# Patient Record
Sex: Male | Born: 1975 | Race: White | Hispanic: Yes | Marital: Married | Smoking: Never smoker
Health system: Southern US, Community
[De-identification: ages and names within clinical notes are randomized; demographics above are authoritative.]

## PROBLEM LIST (undated history)

## (undated) DIAGNOSIS — T7840XA Allergy, unspecified, initial encounter: Secondary | ICD-10-CM

## (undated) DIAGNOSIS — K219 Gastro-esophageal reflux disease without esophagitis: Secondary | ICD-10-CM

## (undated) HISTORY — DX: Gastro-esophageal reflux disease without esophagitis: K21.9

## (undated) HISTORY — DX: Allergy, unspecified, initial encounter: T78.40XA

---

## 2005-01-28 ENCOUNTER — Ambulatory Visit (HOSPITAL_COMMUNITY): Admission: RE | Admit: 2005-01-28 | Discharge: 2005-01-28 | Payer: Self-pay | Admitting: Family Medicine

## 2005-01-28 ENCOUNTER — Emergency Department (HOSPITAL_COMMUNITY): Admission: EM | Admit: 2005-01-28 | Discharge: 2005-01-28 | Payer: Self-pay | Admitting: Family Medicine

## 2005-03-06 ENCOUNTER — Emergency Department (HOSPITAL_COMMUNITY): Admission: EM | Admit: 2005-03-06 | Discharge: 2005-03-06 | Payer: Self-pay | Admitting: Emergency Medicine

## 2005-03-12 ENCOUNTER — Emergency Department (HOSPITAL_COMMUNITY): Admission: EM | Admit: 2005-03-12 | Discharge: 2005-03-12 | Payer: Self-pay | Admitting: Emergency Medicine

## 2005-06-10 ENCOUNTER — Encounter: Admission: RE | Admit: 2005-06-10 | Discharge: 2005-06-10 | Payer: Self-pay | Admitting: Internal Medicine

## 2005-07-26 ENCOUNTER — Ambulatory Visit: Payer: Self-pay | Admitting: Gastroenterology

## 2005-08-01 ENCOUNTER — Ambulatory Visit: Payer: Self-pay | Admitting: Gastroenterology

## 2005-08-03 ENCOUNTER — Ambulatory Visit: Payer: Self-pay | Admitting: Gastroenterology

## 2005-08-30 ENCOUNTER — Ambulatory Visit: Payer: Self-pay | Admitting: Gastroenterology

## 2005-08-30 ENCOUNTER — Ambulatory Visit (HOSPITAL_COMMUNITY): Admission: RE | Admit: 2005-08-30 | Discharge: 2005-08-30 | Payer: Self-pay | Admitting: Gastroenterology

## 2005-10-04 ENCOUNTER — Ambulatory Visit (HOSPITAL_COMMUNITY): Admission: RE | Admit: 2005-10-04 | Discharge: 2005-10-04 | Payer: Self-pay | Admitting: Gastroenterology

## 2005-10-19 ENCOUNTER — Encounter: Admission: RE | Admit: 2005-10-19 | Discharge: 2005-10-19 | Payer: Self-pay | Admitting: Internal Medicine

## 2005-11-03 ENCOUNTER — Encounter: Admission: RE | Admit: 2005-11-03 | Discharge: 2005-11-03 | Payer: Self-pay | Admitting: Internal Medicine

## 2007-07-31 ENCOUNTER — Emergency Department (HOSPITAL_COMMUNITY): Admission: EM | Admit: 2007-07-31 | Discharge: 2007-07-31 | Payer: Self-pay | Admitting: Emergency Medicine

## 2007-08-24 IMAGING — CR DG SMALL BOWEL
1 series · 1 of 1 positions shown · non-contrast
Comparison: none

CLINICAL DATA: Right lower quadrant pain.  
 SMALL BOWEL FOLLOWTHROUGH:

[view not recorded]
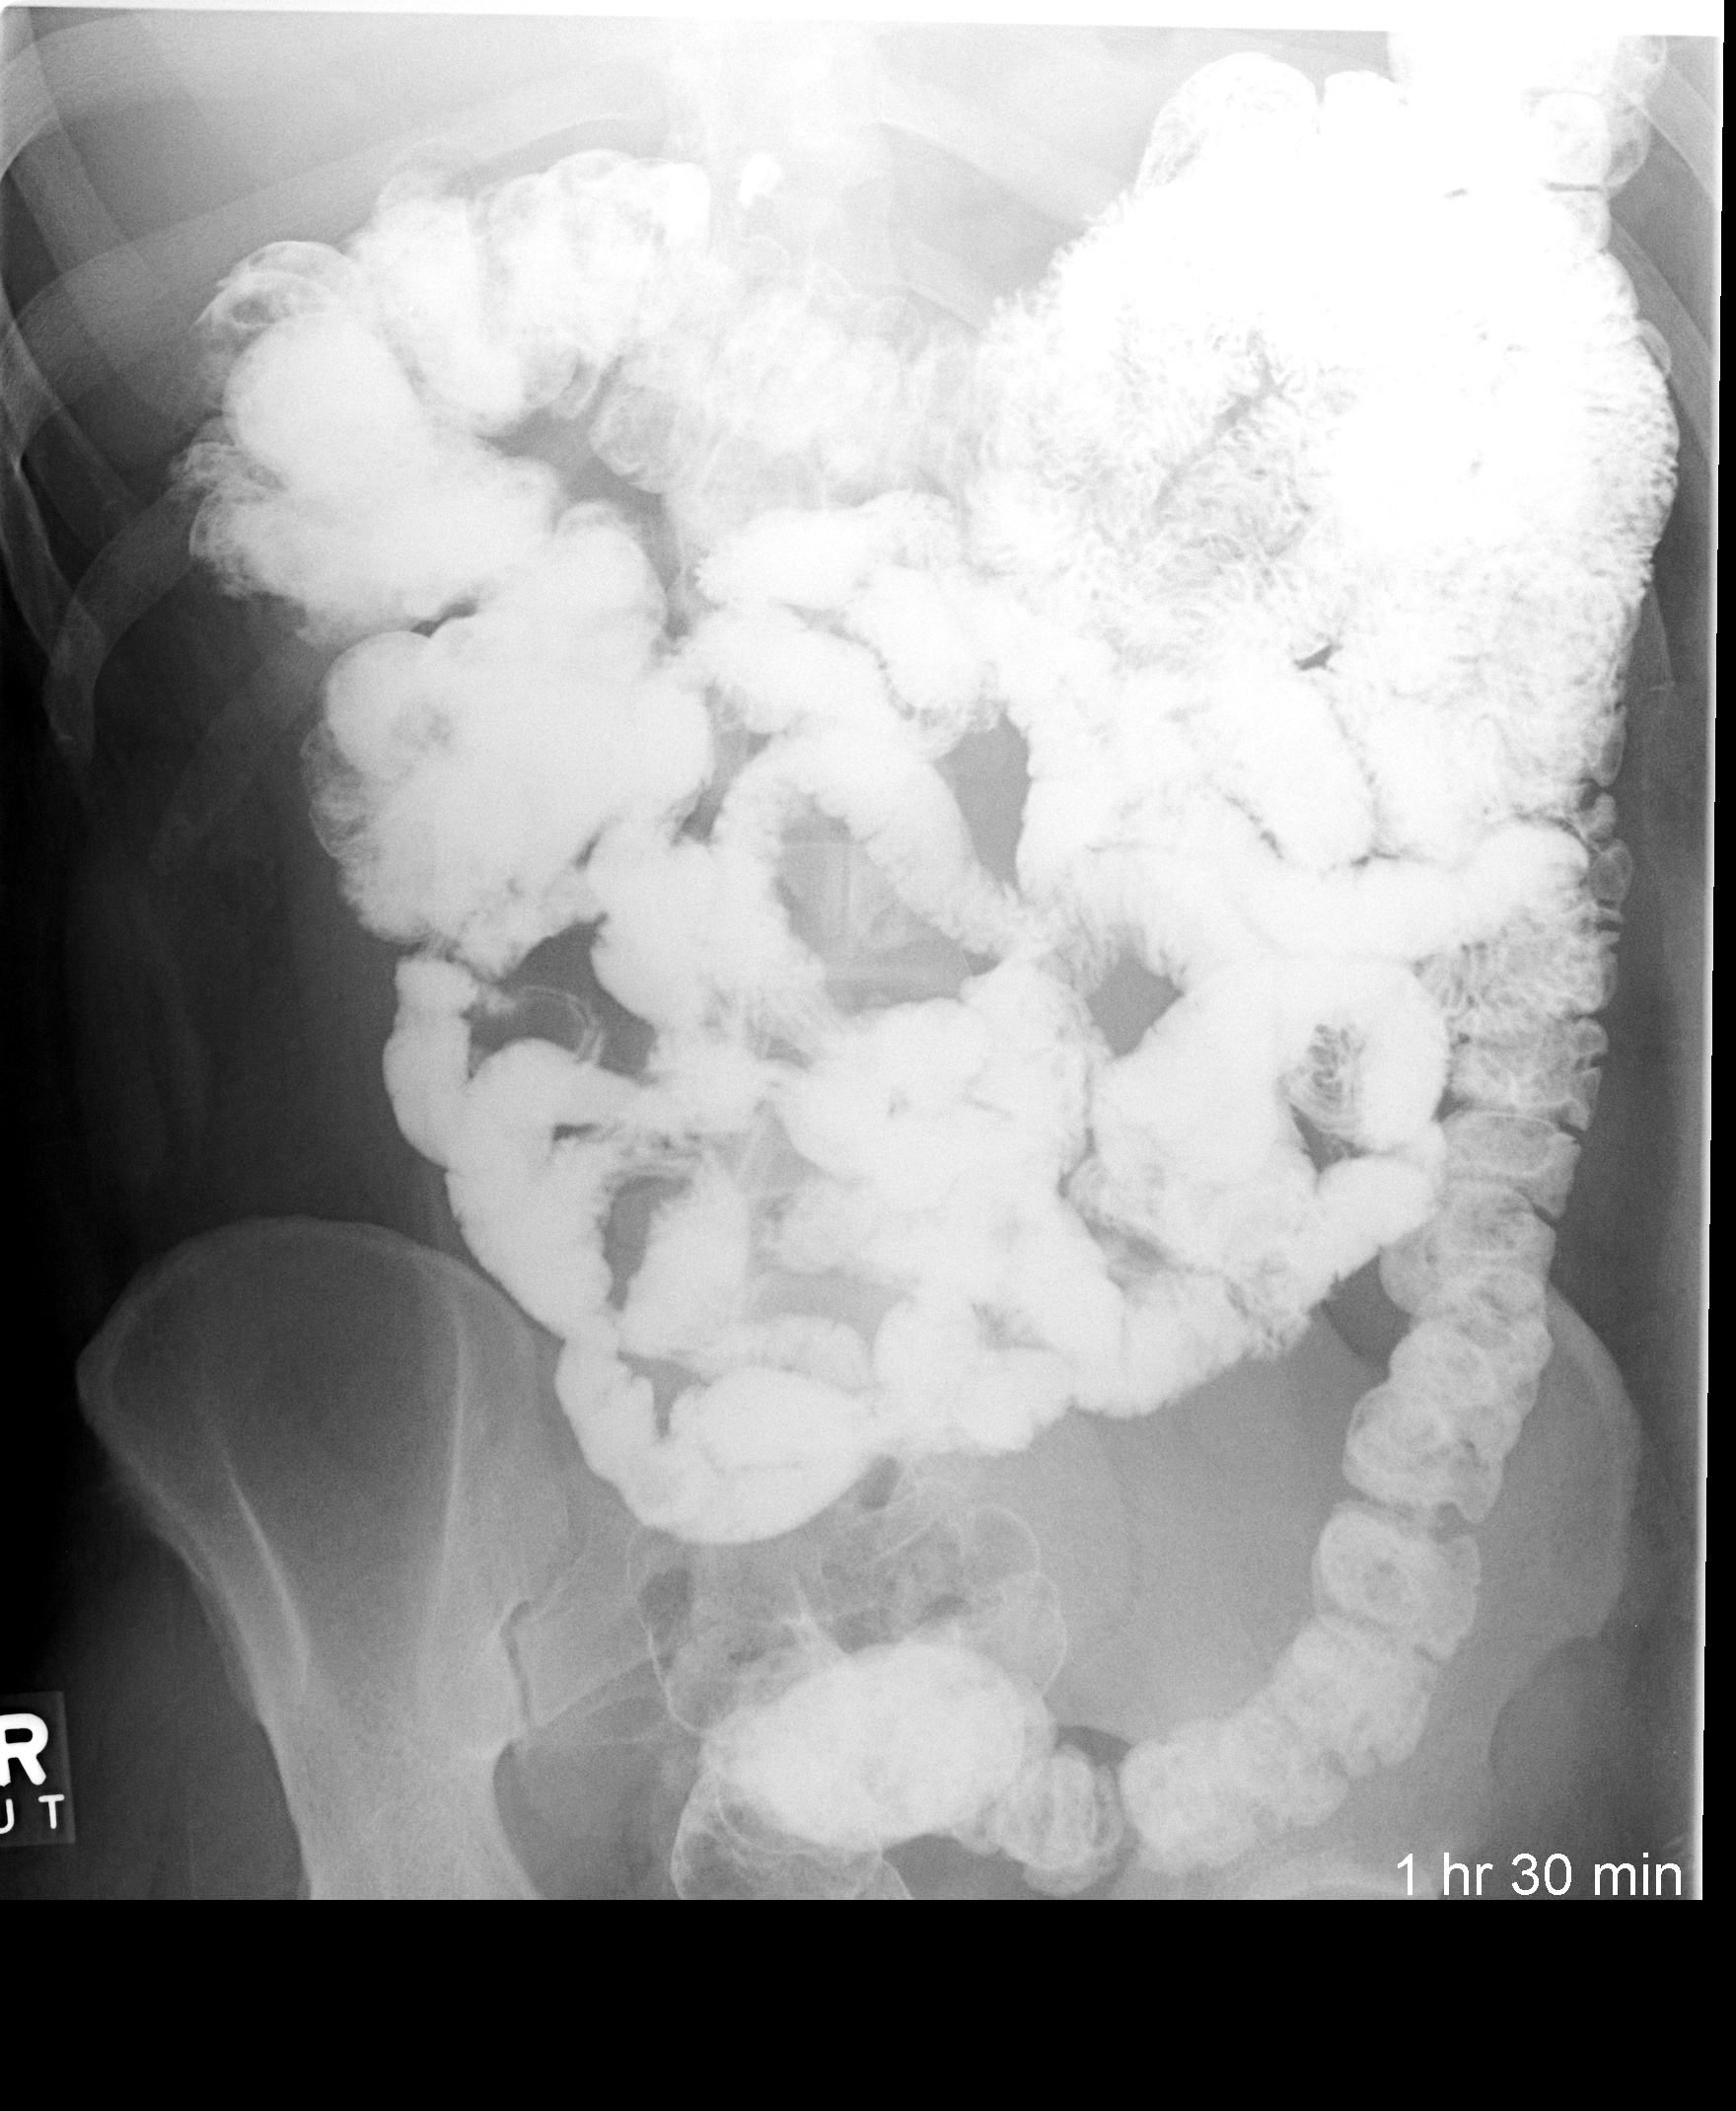

[1 of 1 positions shown; findings below may reference images not displayed]

FINDINGS: Preliminary KUB is unremarkable.  Position and mucosal pattern of the small bowel is normal.  Transit time is normal with the colon being visualized at 1 ? hours.  Terminal ileum is thought to be visualized although the cecum is somewhat medially positioned and is thought to be mobile.  No inflammatory changes or obstruction.
IMPRESSION: 1.  Small bowel followthrough reveals no abnormality. 
 2.  There is thought to be a mobile cecum.

## 2007-12-19 DIAGNOSIS — B356 Tinea cruris: Secondary | ICD-10-CM | POA: Insufficient documentation

## 2007-12-19 DIAGNOSIS — K5900 Constipation, unspecified: Secondary | ICD-10-CM | POA: Insufficient documentation

## 2007-12-19 DIAGNOSIS — M545 Low back pain, unspecified: Secondary | ICD-10-CM | POA: Insufficient documentation

## 2007-12-19 DIAGNOSIS — R635 Abnormal weight gain: Secondary | ICD-10-CM | POA: Insufficient documentation

## 2007-12-24 DIAGNOSIS — R748 Abnormal levels of other serum enzymes: Secondary | ICD-10-CM | POA: Insufficient documentation

## 2007-12-24 DIAGNOSIS — K449 Diaphragmatic hernia without obstruction or gangrene: Secondary | ICD-10-CM | POA: Insufficient documentation

## 2008-04-08 DIAGNOSIS — B9789 Other viral agents as the cause of diseases classified elsewhere: Secondary | ICD-10-CM | POA: Insufficient documentation

## 2008-06-12 ENCOUNTER — Emergency Department (HOSPITAL_COMMUNITY): Admission: EM | Admit: 2008-06-12 | Discharge: 2008-06-12 | Payer: Self-pay | Admitting: Emergency Medicine

## 2008-06-20 ENCOUNTER — Emergency Department (HOSPITAL_COMMUNITY): Admission: EM | Admit: 2008-06-20 | Discharge: 2008-06-20 | Payer: Self-pay | Admitting: Emergency Medicine

## 2008-06-25 DIAGNOSIS — S335XXA Sprain of ligaments of lumbar spine, initial encounter: Secondary | ICD-10-CM | POA: Insufficient documentation

## 2009-12-14 ENCOUNTER — Emergency Department (HOSPITAL_COMMUNITY): Admission: EM | Admit: 2009-12-14 | Discharge: 2009-12-14 | Payer: Self-pay | Admitting: Emergency Medicine

## 2010-10-02 ENCOUNTER — Encounter: Payer: Self-pay | Admitting: Gastroenterology

## 2010-12-01 LAB — CBC
Hemoglobin: 15.8 g/dL (ref 13.0–17.0)
RBC: 5.13 MIL/uL (ref 4.22–5.81)

## 2010-12-01 LAB — DIFFERENTIAL
Basophils Absolute: 0 10*3/uL (ref 0.0–0.1)
Lymphocytes Relative: 10 % — ABNORMAL LOW (ref 12–46)
Monocytes Absolute: 0.6 10*3/uL (ref 0.1–1.0)
Monocytes Relative: 4 % (ref 3–12)
Neutro Abs: 12.2 10*3/uL — ABNORMAL HIGH (ref 1.7–7.7)

## 2010-12-01 LAB — URINALYSIS, ROUTINE W REFLEX MICROSCOPIC
Glucose, UA: NEGATIVE mg/dL
Specific Gravity, Urine: 1.011 (ref 1.005–1.030)

## 2010-12-01 LAB — URINE MICROSCOPIC-ADD ON

## 2010-12-01 LAB — POCT I-STAT, CHEM 8
Chloride: 107 mEq/L (ref 96–112)
Glucose, Bld: 130 mg/dL — ABNORMAL HIGH (ref 70–99)
HCT: 48 % (ref 39.0–52.0)
Potassium: 4.2 mEq/L (ref 3.5–5.1)

## 2011-06-21 LAB — COMPREHENSIVE METABOLIC PANEL
ALT: 71 — ABNORMAL HIGH
AST: 32
Albumin: 3.7
Alkaline Phosphatase: 53
Chloride: 107
GFR calc Af Amer: >60
Potassium: 3.6
Sodium: 139
Total Bilirubin: 0.9

## 2011-06-21 LAB — URINE MICROSCOPIC-ADD ON

## 2011-06-21 LAB — URINALYSIS, ROUTINE W REFLEX MICROSCOPIC
Bilirubin Urine: NEGATIVE
Leukocytes, UA: NEGATIVE
Nitrite: NEGATIVE
Specific Gravity, Urine: 1.019
pH: 5

## 2011-06-21 LAB — CBC
HCT: 42.7
Platelets: 172
WBC: 8.4

## 2011-06-21 LAB — DIFFERENTIAL
Basophils Absolute: 0
Basophils Relative: 0
Eosinophils Absolute: 0.1 — ABNORMAL LOW
Lymphs Abs: 2.1
Neutro Abs: 5.6

## 2013-09-23 ENCOUNTER — Other Ambulatory Visit: Payer: Self-pay | Admitting: Gastroenterology

## 2013-09-23 DIAGNOSIS — R1011 Right upper quadrant pain: Secondary | ICD-10-CM

## 2013-09-25 ENCOUNTER — Other Ambulatory Visit: Payer: Self-pay | Admitting: Gastroenterology

## 2013-09-25 DIAGNOSIS — R1011 Right upper quadrant pain: Secondary | ICD-10-CM

## 2013-10-10 ENCOUNTER — Encounter (HOSPITAL_COMMUNITY)
Admission: RE | Admit: 2013-10-10 | Discharge: 2013-10-10 | Disposition: A | Discharge status: Home / Self Care | Payer: BC Managed Care – PPO | Source: Ambulatory Visit | Attending: Gastroenterology | Admitting: Gastroenterology

## 2013-10-10 DIAGNOSIS — R1011 Right upper quadrant pain: Secondary | ICD-10-CM | POA: Insufficient documentation

## 2013-10-10 MED ORDER — STERILE WATER FOR INJECTION IJ SOLN
INTRAMUSCULAR | Status: AC
  Filled 2013-10-10: qty 10

## 2013-10-10 MED ORDER — TECHNETIUM TC 99M MEBROFENIN IV KIT
5.0000 | PACK | Freq: Once | INTRAVENOUS | Status: AC | PRN
Start: 2013-10-10 — End: 2013-10-10
  Administered 2013-10-10: 5 via INTRAVENOUS

## 2013-10-10 MED ORDER — SINCALIDE 5 MCG IJ SOLR
INTRAMUSCULAR | Status: AC
  Administered 2013-10-10: 1.73 ug
  Filled 2013-10-10: qty 5

## 2015-11-06 DIAGNOSIS — K219 Gastro-esophageal reflux disease without esophagitis: Secondary | ICD-10-CM | POA: Insufficient documentation

## 2018-04-23 ENCOUNTER — Other Ambulatory Visit: Payer: Self-pay

## 2018-04-23 ENCOUNTER — Encounter (HOSPITAL_COMMUNITY): Payer: Self-pay | Admitting: Emergency Medicine

## 2018-04-23 ENCOUNTER — Emergency Department (HOSPITAL_COMMUNITY)
Admission: EM | Admit: 2018-04-23 | Discharge: 2018-04-23 | Disposition: A | Discharge status: Home / Self Care | Payer: Self-pay | Attending: Emergency Medicine | Admitting: Emergency Medicine

## 2018-04-23 DIAGNOSIS — Z91010 Allergy to peanuts: Secondary | ICD-10-CM | POA: Insufficient documentation

## 2018-04-23 DIAGNOSIS — Z87442 Personal history of urinary calculi: Secondary | ICD-10-CM | POA: Insufficient documentation

## 2018-04-23 DIAGNOSIS — R109 Unspecified abdominal pain: Secondary | ICD-10-CM | POA: Insufficient documentation

## 2018-04-23 LAB — URINALYSIS, ROUTINE W REFLEX MICROSCOPIC
Bacteria, UA: NONE SEEN
Bilirubin Urine: NEGATIVE
Glucose, UA: NEGATIVE mg/dL
Ketones, ur: NEGATIVE mg/dL
Leukocytes, UA: NEGATIVE
Nitrite: NEGATIVE
PROTEIN: NEGATIVE mg/dL
SPECIFIC GRAVITY, URINE: 1.014 (ref 1.005–1.030)
pH: 6 (ref 5.0–8.0)

## 2018-04-23 MED ORDER — IBUPROFEN 400 MG PO TABS
400.0000 mg | ORAL_TABLET | Freq: Once | ORAL | Status: AC | PRN
  Administered 2018-04-23: 400 mg via ORAL
  Filled 2018-04-23: qty 1

## 2018-04-23 MED ORDER — TAMSULOSIN HCL 0.4 MG PO CAPS: 0.4000 mg | ORAL_CAPSULE | Freq: Every day | ORAL | 0 refills | Status: DC

## 2018-04-23 NOTE — ED Provider Notes (Signed)
[facility]   CSN: [id number] Arrival date & time: 04/23/18  1844     History   Chief Complaint Chief Complaint  Patient presents with  . Flank Pain    HPI Matthew Mcpherson is a 42 y.o. male.  Patient here for evaluation of left flank pain that started several days ago. No fever, dysuria or frequency. He has seen blood in his urine. The pain radiates to groin but he denies scrotal swelling or significant testicular pain. He has a history of kidney stones and reports this has felt the same. Since arrival and prior to being seen the patient reports he went to the bathroom and felt that he may have passed something when urinating. The pain has been resolved since then.   The history is provided by the patient. No language interpreter was used.    History reviewed. No pertinent past medical history.  There are no active problems to display for this patient.   History reviewed. No pertinent surgical history.      Home Medications    Prior to Admission medications   Not on File    Family History No family history on file.  Social History Social History   Tobacco Use  . Smoking status: Never Smoker  . Smokeless tobacco: Never Used  Substance Use Topics  . Alcohol use: Not on file  . Drug use: Not on file     Allergies   Peanut-containing drug products   Review of Systems Review of Systems  Constitutional: Negative for fever.  Gastrointestinal: Positive for nausea. Negative for vomiting.  Genitourinary: Positive for flank pain and hematuria. Negative for difficulty urinating and dysuria.  Skin: Negative.   Neurological: Negative.      Physical Exam Updated Vital Signs BP 122/76   Pulse (!) 53   Temp 98.2 F (36.8 C) (Oral)   Resp 16   Ht 5\' 4"  (1.626 m)   Wt 81.6 kg   SpO2 100%   BMI 30.90 kg/m   Physical Exam  Constitutional: He is oriented to person, place, and time. He appears  well-developed and well-nourished.  Neck: Normal range of motion.  Pulmonary/Chest: Effort normal.  Abdominal: Soft. There is no tenderness.  Genitourinary:  Genitourinary Comments: No flank tenderness.   Musculoskeletal: Normal range of motion.  Neurological: He is alert and oriented to person, place, and time.  Skin: Skin is warm and dry.  Psychiatric: He has a normal mood and affect.  Nursing note and vitals reviewed.    ED Treatments / Results  Labs (all labs ordered are listed, but only abnormal results are displayed) Labs Reviewed  URINALYSIS, ROUTINE W REFLEX MICROSCOPIC - Abnormal; Notable for the following components:      Result Value   Hgb urine dipstick LARGE (*)    All other components within normal limits    EKG None  Radiology No results found.  Procedures Procedures (including critical care time)  Medications Ordered in ED Medications  ibuprofen (ADVIL,MOTRIN) tablet 400 mg (400 mg Oral Given 04/23/18 1917)     Initial Impression / Assessment and Plan / ED Course  I have reviewed the triage vital signs and the nursing notes.  Pertinent labs & imaging results that were available during my care of the patient were reviewed by me and considered in my medical decision making (see chart for details).     Patient with history of kidney stones presents with left flank pain and gross hematuria  x 2-3 days. Since arrival he feels he passed a stone in his urine prior to being seen and has had no further pain.   CT imaging was not felt necessary or appropriate with presentation c/w recurrent kidney stone that has passed. He is comfortable with discharge home. Rx Flomax given is he has recurrent symptoms. Return precautions discussed.   Final Clinical Impressions(s) / ED Diagnoses   Final diagnoses:  None   Renal colic  ED Discharge Orders    None       Danne HarborUpstill, Winni Ehrhard, PA-C 04/23/18 2342    Gwyneth SproutPlunkett, [place], MD 04/23/18 2349

## 2018-04-23 NOTE — ED Notes (Signed)
Discharge instructions discussed with Pt. Pt verbalized understanding. Pt stable and ambulatory.    

## 2018-04-23 NOTE — ED Triage Notes (Signed)
Pt reports left flank pain that started Friday. Pt reports nausea and hematuria. Pt reports otc powder with some relief. Hx of kidney stones and reports it feels like it.

## 2019-10-09 ENCOUNTER — Emergency Department (HOSPITAL_COMMUNITY)
Admission: EM | Admit: 2019-10-09 | Discharge: 2019-10-09 | Disposition: A | Discharge status: Home / Self Care | Payer: Self-pay | Attending: Emergency Medicine | Admitting: Emergency Medicine

## 2019-10-09 ENCOUNTER — Emergency Department (HOSPITAL_COMMUNITY): Payer: Self-pay

## 2019-10-09 DIAGNOSIS — N134 Hydroureter: Secondary | ICD-10-CM | POA: Insufficient documentation

## 2019-10-09 DIAGNOSIS — N201 Calculus of ureter: Secondary | ICD-10-CM

## 2019-10-09 DIAGNOSIS — Z79899 Other long term (current) drug therapy: Secondary | ICD-10-CM | POA: Insufficient documentation

## 2019-10-09 DIAGNOSIS — R110 Nausea: Secondary | ICD-10-CM | POA: Insufficient documentation

## 2019-10-09 DIAGNOSIS — N130 Hydronephrosis with ureteropelvic junction obstruction: Secondary | ICD-10-CM | POA: Insufficient documentation

## 2019-10-09 DIAGNOSIS — Z87442 Personal history of urinary calculi: Secondary | ICD-10-CM | POA: Insufficient documentation

## 2019-10-09 LAB — COMPREHENSIVE METABOLIC PANEL
ALT: 38 U/L (ref 0–44)
AST: 24 U/L (ref 15–41)
Albumin: 4 g/dL (ref 3.5–5.0)
Alkaline Phosphatase: 52 U/L (ref 38–126)
Anion gap: 9 (ref 5–15)
BUN: 11 mg/dL (ref 6–20)
CO2: 25 mmol/L (ref 22–32)
Calcium: 8.7 mg/dL — ABNORMAL LOW (ref 8.9–10.3)
Chloride: 102 mmol/L (ref 98–111)
Creatinine, Ser: 0.98 mg/dL (ref 0.61–1.24)
GFR calc Af Amer: >60 mL/min (ref >60)
GFR calc non Af Amer: >60 mL/min (ref >60)
Glucose, Bld: 109 mg/dL — ABNORMAL HIGH (ref 70–99)
Potassium: 3.8 mmol/L (ref 3.5–5.1)
Sodium: 136 mmol/L (ref 135–145)
Total Bilirubin: 1.1 mg/dL (ref 0.3–1.2)
Total Protein: 7.1 g/dL (ref 6.5–8.1)

## 2019-10-09 LAB — CBC
HCT: 44.5 % (ref 39.0–52.0)
Hemoglobin: 15.2 g/dL (ref 13.0–17.0)
MCH: 30 pg (ref 26.0–34.0)
MCHC: 34.2 g/dL (ref 30.0–36.0)
MCV: 87.9 fL (ref 80.0–100.0)
Platelets: 183 10*3/uL (ref 150–400)
RBC: 5.06 MIL/uL (ref 4.22–5.81)
RDW: 12.5 % (ref 11.5–15.5)
WBC: 5.7 10*3/uL (ref 4.0–10.5)
nRBC: 0 % (ref 0.0–0.2)

## 2019-10-09 LAB — URINALYSIS, ROUTINE W REFLEX MICROSCOPIC
Bacteria, UA: NONE SEEN
Bilirubin Urine: NEGATIVE
Glucose, UA: NEGATIVE mg/dL
Ketones, ur: NEGATIVE mg/dL
Leukocytes,Ua: NEGATIVE
Nitrite: NEGATIVE
Protein, ur: NEGATIVE mg/dL
Specific Gravity, Urine: 1.004 — ABNORMAL LOW (ref 1.005–1.030)
pH: 7 (ref 5.0–8.0)

## 2019-10-09 LAB — LIPASE, BLOOD: Lipase: 24 U/L (ref 11–51)

## 2019-10-09 MED ORDER — SODIUM CHLORIDE 0.9 % IV BOLUS
1000.0000 mL | Freq: Once | INTRAVENOUS | Status: AC
  Administered 2019-10-09: 1000 mL via INTRAVENOUS

## 2019-10-09 MED ORDER — MORPHINE SULFATE (PF) 4 MG/ML IV SOLN
4.0000 mg | Freq: Once | INTRAVENOUS | Status: AC
  Administered 2019-10-09: 11:00:00 4 mg via INTRAVENOUS
  Filled 2019-10-09: qty 1

## 2019-10-09 MED ORDER — OXYCODONE-ACETAMINOPHEN 5-325 MG PO TABS: 1.0000 | ORAL_TABLET | ORAL | 0 refills | Status: DC | PRN

## 2019-10-09 MED ORDER — TAMSULOSIN HCL 0.4 MG PO CAPS: 0.4000 mg | ORAL_CAPSULE | Freq: Every day | ORAL | 0 refills | Status: DC

## 2019-10-09 MED ORDER — ONDANSETRON 4 MG PO TBDP: 4.0000 mg | ORAL_TABLET | Freq: Three times a day (TID) | ORAL | 0 refills | Status: DC | PRN

## 2019-10-09 MED ORDER — ONDANSETRON HCL 4 MG/2ML IJ SOLN
4.0000 mg | Freq: Once | INTRAMUSCULAR | Status: AC
  Administered 2019-10-09: 4 mg via INTRAVENOUS
  Filled 2019-10-09: qty 2

## 2019-10-09 NOTE — ED Provider Notes (Signed)
[facility] EMERGENCY DEPARTMENT Provider Note   CSN: [id number] Arrival date & time: 10/09/19  3903     History Chief Complaint  Patient presents with  . Flank Pain    Matthew Mcpherson is a 44 y.o. male.  HPI  Patient resents with right flank pain that radiates to his right lower quadrant has been progressively worsening for the past 3 weeks.  States he has a history of kidney stones and states that this pain feels similar to prior.  States that today he woke up with severe 10/10 pain that is stabbing intermittent in his right lower quadrant that radiates to his groin and testicles.  Denies any concern for sexually transmitted diseases, denies any testicular pain, penile discharge, urinary symptoms.  Denies any gross hematuria.  Patient states his last kidney stone was approximately 10 years ago.  States that he was seen in ED for possible kidney stone August 2019 but did not CT scan at the time because his pain improved during ED visit and he was presumed to have passed it.  Patient endorses associated chills and nausea with no vomiting.  No fevers, diaphoresis, chest pain change in bowel movements.  No cough or congestion.    No past medical history on file.  There are no problems to display for this patient.   No past surgical history on file.     No family history on file.  Social History   Tobacco Use  . Smoking status: Never Smoker  . Smokeless tobacco: Never Used  Substance Use Topics  . Alcohol use: Not on file  . Drug use: Not on file    Home Medications Prior to Admission medications   Medication Sig Start Date End Date Taking? Authorizing Provider  cetirizine (ZYRTEC) 10 MG tablet Take 10 mg by mouth daily.   Yes [provider]  omeprazole (PRILOSEC) 20 MG capsule Take 20 mg by mouth daily.   Yes [provider]  ondansetron (ZOFRAN ODT) 4 MG disintegrating tablet Take 1 tablet (4 mg total) by mouth every 8 (eight) hours  as needed for nausea or vomiting. 10/09/19   Gailen Shelter, PA  oxyCODONE-acetaminophen (PERCOCET/ROXICET) 5-325 MG tablet Take 1-2 tablets by mouth every 4 (four) hours as needed for severe pain. 10/09/19   Gailen Shelter, PA    Allergies    Peanut-containing drug products  Review of Systems   Review of Systems  Constitutional: Positive for chills. Negative for fever.  HENT: Negative for congestion.   Eyes: Negative for pain.  Respiratory: Negative for cough and shortness of breath.   Cardiovascular: Negative for chest pain and leg swelling.  Gastrointestinal: Positive for abdominal pain. Negative for vomiting.  Genitourinary: Positive for flank pain. Negative for dysuria.  Musculoskeletal: Negative for myalgias.  Skin: Negative for rash.  Neurological: Negative for dizziness and headaches.    Physical Exam Updated Vital Signs BP (!) 161/92 (BP Location: Left Arm)   Pulse 74   Temp 97.6 F (36.4 C) (Oral)   Resp (!) 22   SpO2 100%   Physical Exam Vitals and nursing note reviewed.  Constitutional:      General: He is not in acute distress. HENT:     Head: Normocephalic and atraumatic.     Nose: Nose normal.     Mouth/Throat:     Mouth: Mucous membranes are dry.     Pharynx: Oropharynx is clear. No oropharyngeal exudate.     Comments: Or mucosa slightly dry.  Eyes:     General: No scleral icterus.    Extraocular Movements: Extraocular movements intact.     Pupils: Pupils are equal, round, and reactive to light.  Cardiovascular:     Rate and Rhythm: Normal rate and regular rhythm.     Pulses: Normal pulses.     Heart sounds: Normal heart sounds.  Pulmonary:     Effort: Pulmonary effort is normal. No respiratory distress.     Breath sounds: No wheezing.  Abdominal:     Palpations: Abdomen is soft.     Tenderness: There is abdominal tenderness. There is right CVA tenderness. There is no left CVA tenderness, guarding or rebound.     Comments: Abdomen is soft and  nontender apart from right lower quadrant abdominal pain with palpation.  No rebound or guarding.  Positive right CVA tenderness.  No CVA tenderness on left.  Musculoskeletal:     Cervical back: Normal range of motion.     Right lower leg: No edema.     Left lower leg: No edema.  Skin:    General: Skin is warm and dry.     Capillary Refill: Capillary refill takes less than 2 seconds.  Neurological:     Mental Status: He is alert. Mental status is at baseline.  Psychiatric:        Mood and Affect: Mood normal.        Behavior: Behavior normal.     ED Results / Procedures / Treatments   Labs (all labs ordered are listed, but only abnormal results are displayed) Labs Reviewed  URINALYSIS, ROUTINE W REFLEX MICROSCOPIC - Abnormal; Notable for the following components:      Result Value   Color, Urine STRAW (*)    Specific Gravity, Urine 1.004 (*)    Hgb urine dipstick SMALL (*)    All other components within normal limits  COMPREHENSIVE METABOLIC PANEL - Abnormal; Notable for the following components:   Glucose, Bld 109 (*)    Calcium 8.7 (*)    All other components within normal limits  CBC  LIPASE, BLOOD    EKG None  Radiology CT Renal Stone Study  Result Date: 10/09/2019 CLINICAL DATA:  RIGHT flank pain for 3 weeks becoming acute today EXAM: CT ABDOMEN AND PELVIS WITHOUT CONTRAST TECHNIQUE: Multidetector CT imaging of the abdomen and pelvis was performed following the standard protocol without IV contrast. Sagittal and coronal MPR images reconstructed from axial data set. Oral contrast not administered for this indication. COMPARISON:  12/14/2009 FINDINGS: Lower chest: Lung bases clear Hepatobiliary: Gallbladder and liver normal appearance Pancreas: Normal appearance Spleen: Normal appearance Adrenals/Urinary Tract: Adrenal glands, LEFT kidney, and LEFT ureter normal appearance. Mild RIGHT hydronephrosis and hydroureter secondary to a 1-2 mm distal RIGHT ureteral calculus at the  ureterovesical junction. Minimal perinephric edema RIGHT kidney without RIGHT renal mass. Stomach/Bowel: Normal appendix. Few scattered colonic diverticula. Bowel loops otherwise normal appearance. Stomach unremarkable. Vascular/Lymphatic: Aorta normal caliber.  No adenopathy. Reproductive: Prostate gland and seminal vesicles unremarkable Other: No free air or free fluid. No hernia or inflammatory process. Musculoskeletal: Unremarkable IMPRESSION: RIGHT hydronephrosis and hydroureter secondary to a 1-2 mm RIGHT ureterovesical junction calculus. Electronically Signed   By: Ulyses Southward M.D.   On: 10/09/2019 11:15    Procedures Procedures (including critical care time)  Medications Ordered in ED Medications  morphine 4 MG/ML injection 4 mg (4 mg Intravenous Given 10/09/19 1129)  ondansetron (ZOFRAN) injection 4 mg (4 mg Intravenous Given 10/09/19 1129)  sodium  chloride 0.9 % bolus 1,000 mL (1,000 mLs Intravenous New Bag/Given 10/09/19 1129)    ED Course  I have reviewed the triage vital signs and the nursing notes.  Pertinent labs & imaging results that were available during my care of the patient were reviewed by me and considered in my medical decision making (see chart for details).  Patient with history of kidney stones presented today with right flank pain and right lower quadrant Donnell pain that is similar to his prior kidney stone episodes.  Low suspicion for PE as patient is not tachycardic or hypoxic.  Mild concern for appendicitis.  Will obtain CBC to evaluate if patient has leukocytosis and calculate avagadro score.  Patient is without leukocytosis.  avagadro score is low risk for appendicitis.  Clinical Course as of Oct 08 1217  Wed Oct 09, 2019  1215 With no evidence of infection small hemoglobin consistent with ureteral stone.  Urinalysis, Routine w reflex microscopic- may I&O cath if menses(!) [WF]  1215 CMP with no AKI or electrolyte abnormalities  Comprehensive metabolic  panel(!) [WF]  1216 No leukocytosis.  Doubt infection.  CBC [WF]  1216 CT Renal Stone Study [WF]  1216 RIGHT hydronephrosis and hydroureter secondary to a 1-2 mm RIGHT ureterovesical junction calculus.  Independently reviewed CT imaging.  Will discuss results with patient.   [WF]    Clinical Course User Index [WF] Gailen Shelter, [place]   Patient was small 2 mm right UVJ calculus.  Stones this size pass more than 75% of the time.  Will give patient pain medication and Zofran.  We will also prescribe Flomax.  Discussed medications with patient.  Patient given urology follow-up and a urine strainer.  He is well-appearing at this time.  The medical records were personally reviewed by myself. I personally reviewed all lab results and interpreted all imaging studies and either concurred with their official read or contacted radiology for clarification.   This patient appears reasonably screened and I doubt any other medical condition requiring further workup, evaluation, or treatment in the ED at this time prior to discharge.   Patient's vitals are WNL apart from vital sign abnormalities discussed above, patient is in NAD, and able to ambulate in the ED at their baseline and able to tolerate PO.  Pain has been managed or a plan has been made for home management and has no complaints prior to discharge. Patient is comfortable with above plan and for discharge at this time. All questions were answered prior to disposition. Results from the ER workup discussed with the patient face to face and all questions answered to the best of my ability. The patient is safe for discharge with strict return precautions. Patient appears safe for discharge with appropriate follow-up. Conveyed my impression with the patient and they voiced understanding and are agreeable to plan.   An After Visit Summary was printed and given to the patient.  Portions of this note were generated with Scientist, clinical (histocompatibility and immunogenetics).  Dictation errors may occur despite best attempts at proofreading.    MDM Rules/Calculators/A&P                       Final Clinical Impression(s) / ED Diagnoses Final diagnoses:  Ureterolithiasis    Rx / DC Orders ED Discharge Orders         Ordered    oxyCODONE-acetaminophen (PERCOCET/ROXICET) 5-325 MG tablet  Every 4 hours PRN     10/09/19 1217    ondansetron (  ZOFRAN ODT) 4 MG disintegrating tablet  Every 8 hours PRN     10/09/19 1217           Tedd Sias, [place] 10/09/19 1222    Davonna Belling, MD 10/09/19 1555

## 2019-10-09 NOTE — Discharge Instructions (Addendum)
Please stay hydrated as much as possible.  Take Percocet for pain as needed and Zofran for nausea.  Please strain all of your urine and follow-up with urology for analysis of any stone that you are able to procure.  I have also sent you a prescription for Flomax which you may fill or not fill at your discretion.  Evidence shows that this may help with passage of stone however I do not think that it is required for stone of the size you have.  I suspect that with time and good pain control it would pass.

## 2019-10-09 NOTE — ED Notes (Signed)
Pt to ct 

## 2019-10-09 NOTE — ED Triage Notes (Signed)
Pt reports right flank pain for 3 weeks gradually has become worse radiates into RLQ. Pt had "pain killer" at home and did take that doesn't really help. Hx of kidney stone.

## 2020-07-16 ENCOUNTER — Ambulatory Visit (INDEPENDENT_AMBULATORY_CARE_PROVIDER_SITE_OTHER): Payer: Self-pay

## 2020-07-16 ENCOUNTER — Other Ambulatory Visit: Payer: Self-pay

## 2020-07-16 ENCOUNTER — Ambulatory Visit (INDEPENDENT_AMBULATORY_CARE_PROVIDER_SITE_OTHER): Payer: Self-pay | Admitting: Physician Assistant

## 2020-07-16 VITALS — BP 143/92 | HR 74 | Temp 96.3°F | Resp 18 | Ht 65.0 in | Wt 190.0 lb

## 2020-07-16 DIAGNOSIS — J45909 Unspecified asthma, uncomplicated: Secondary | ICD-10-CM | POA: Insufficient documentation

## 2020-07-16 DIAGNOSIS — J302 Other seasonal allergic rhinitis: Secondary | ICD-10-CM

## 2020-07-16 DIAGNOSIS — R030 Elevated blood-pressure reading, without diagnosis of hypertension: Secondary | ICD-10-CM | POA: Insufficient documentation

## 2020-07-16 DIAGNOSIS — K219 Gastro-esophageal reflux disease without esophagitis: Secondary | ICD-10-CM

## 2020-07-16 DIAGNOSIS — R0602 Shortness of breath: Secondary | ICD-10-CM | POA: Insufficient documentation

## 2020-07-16 MED ORDER — MONTELUKAST SODIUM 10 MG PO TABS: 10.0000 mg | ORAL_TABLET | Freq: Every day | ORAL | 3 refills | Status: DC

## 2020-07-16 MED ORDER — OMEPRAZOLE 40 MG PO CPDR: 40.0000 mg | DELAYED_RELEASE_CAPSULE | Freq: Every day | ORAL | 3 refills | Status: DC

## 2020-07-16 MED ORDER — ALBUTEROL SULFATE HFA 108 (90 BASE) MCG/ACT IN AERS: 2.0000 | INHALATION_SPRAY | Freq: Four times a day (QID) | RESPIRATORY_TRACT | 0 refills | Status: DC | PRN

## 2020-07-16 MED ORDER — CETIRIZINE HCL 10 MG PO TABS: 10.0000 mg | ORAL_TABLET | Freq: Every day | ORAL | 2 refills | Status: DC

## 2020-07-16 NOTE — Progress Notes (Signed)
Patient complains of seasonal allergies occurring. Patient shares this morning he inhaled some dust and began to feel "SOB" and began to panic. Patient has been using OTC allergy medication for years and shares he would like to discuss an affordable prescription or alternative

## 2020-07-16 NOTE — Patient Instructions (Signed)
Please take the zyrtec in the morning and the singulair at bedtime.  Please use the inhaler as needed  Please  check your blood pressure at home, keep written log, bring log to next office visit.  Please go to [facility] for the chest xray.  We will call you with the results.  I hope that you feel better soon  [name], PA-C Physician Assistant [facility] Medicine [email or web address]   Bronchospasm, Adult  Bronchospasm is when airways in the lungs get smaller. When this happens, it can be hard to breathe. You may cough. You may also make a whistling sound when you breathe (wheeze). Follow these instructions at home: Medicines  Take over-the-counter and prescription medicines only as told by your doctor.  If you need to use an inhaler or nebulizer to take your medicine, ask your doctor how to use it.  If you were given a spacer, always use it with your inhaler. Lifestyle  Change your heating and air conditioning filter. Do this at least once a month.  Try not to use fireplaces and wood stoves.  Do not  smoke. Do not  allow smoking in your home.  Try not to use things that have a strong smell, like perfume.  Get rid of pests (such as roaches and mice) and their poop.  Remove any mold from your home.  Keep your house clean. Get rid of dust.  Use cleaning products that have no smell.  Replace carpet with wood, tile, or vinyl flooring.  Use allergy-proof pillows, mattress covers, and box spring covers.  Wash bed sheets and blankets every week. Use hot water. Dry them in a dryer.  Use blankets that are made of polyester or cotton.  Wash your hands often.  Keep pets out of your bedroom.  When you exercise, try not to breathe in cold air. General instructions  Have a plan for getting medical care. Know these things: ? When to call your doctor. ? When to call local emergency services (911 in the  [place] ? Where to go in an emergency.  Stay up to date on your shots (immunizations).  When you have an episode: ? Stay calm. ? Relax. ? Breathe slowly. Contact a doctor if:  Your muscles ache.  Your chest hurts.  The color of the mucus you cough up (sputum) changes from clear or white to yellow, green, gray, or bloody.  The mucus you cough up gets thicker.  You have a fever. Get help right away if:  The whistling sound gets worse, even after you take your medicines.  Your coughing gets worse.  You find it even harder to breathe.  Your chest hurts very much. Summary  Bronchospasm is when airways in the lungs get smaller.  When this happens, it can be hard to breathe. You may cough. You may also make a whistling sound when you breathe.  Stay away from things that cause you to have episodes. These include smoke or dust. This information is not intended to replace advice given to you by your health care provider. Make sure you discuss any questions you have with your health care provider. Document Revised: 08/11/2017 Document Reviewed: 09/01/2016 Elsevier Patient Education  2020 [employer] Inc.   Blood Pressure Record Sheet To take your blood pressure, you will need a blood pressure machine. You can buy a blood pressure machine (blood pressure monitor) at your clinic, drug store, or online. When choosing one, consider:  An automatic monitor that has  an arm cuff.  A cuff that wraps snugly around your upper arm. You should be able to fit only one finger between your arm and the cuff.  A device that stores blood pressure reading results.  Do not choose a monitor that measures your blood pressure from your wrist or finger. Follow your health care provider's instructions for how to take your blood pressure. To use this form:  Get one reading in the morning (a.m.) before you take any medicines.  Get one reading in the evening (p.m.) before supper.  Take at least 2  readings with each blood pressure check. This makes sure the results are correct. Wait 1-2 minutes between measurements.  Write down the results in the spaces on this form.  Repeat this once a week, or as told by your health care provider.  Make a follow-up appointment with your health care provider to discuss the results. Blood pressure log Date: _______________________  a.m. _____________________(1st reading) _____________________(2nd reading)  p.m. _____________________(1st reading) _____________________(2nd reading) Date: _______________________  a.m. _____________________(1st reading) _____________________(2nd reading)  p.m. _____________________(1st reading) _____________________(2nd reading) Date: _______________________  a.m. _____________________(1st reading) _____________________(2nd reading)  p.m. _____________________(1st reading) _____________________(2nd reading) Date: _______________________  a.m. _____________________(1st reading) _____________________(2nd reading)  p.m. _____________________(1st reading) _____________________(2nd reading) Date: _______________________  a.m. _____________________(1st reading) _____________________(2nd reading)  p.m. _____________________(1st reading) _____________________(2nd reading) This information is not intended to replace advice given to you by your health care provider. Make sure you discuss any questions you have with your health care provider. Document Revised: 10/27/2017 Document Reviewed: 08/29/2017 Elsevier Patient Education  2020 [employer].

## 2020-07-16 NOTE — Progress Notes (Signed)
New Patient Office Visit  Subjective:  Patient ID: Matthew Mcpherson, male    DOB: 12-02-1975  Age: 44 y.o. MRN: [id number]  CC:  Chief Complaint  Patient presents with  . Allergies    HPI Matthew Mcpherson reports that he started having difficult breathing earlier today after cleaning out a dryer vent.  States that he felt like he was beginning to panic.  States that he has also started having this happen when he would cut the grass at his house despite wearing a mask.  States that he will continue to wheeze and cough for several hours.  States that he has been using Zyrtec and OTC decongestants without much relief.    States that will get itchy ears and eyes and excessive sinus drainage most days.  Denies previous diagnosis of asthma, no smoking history.  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  Reports history of GERD, has been taking omeprazole 40 mg for several years.  Reports that he has done a trial of stopping the medication with symptoms returning.  Request refill  Reports that he has not been treated for hypertension in the past, does not check blood pressure at home    History reviewed. No pertinent past medical history.  History reviewed. No pertinent surgical history.  History reviewed. No pertinent family history.  Social History   Socioeconomic History  . Marital status: Legally Separated    Spouse name: Not on file  . Number of children: Not on file  . Years of education: Not on file  . Highest education level: Not on file  Occupational History  . Not on file  Tobacco Use  . Smoking status: Never Smoker  . Smokeless tobacco: Never Used  Vaping Use  . Vaping Use: Never used  Substance and Sexual Activity  . Alcohol use: Not Currently  . Drug use: Never  . Sexual activity: Not Currently  Other Topics Concern  . Not on file  Social History Narrative  . Not on file   Social Determinants of Health   Financial Resource  Strain:   . Difficulty of Paying Living Expenses: Not on file  Food Insecurity:   . Worried About Programme researcher, broadcasting/film/video in the Last Year: Not on file  . Ran Out of Food in the Last Year: Not on file  Transportation Needs:   . Lack of Transportation (Medical): Not on file  . Lack of Transportation (Non-Medical): Not on file  Physical Activity:   . Days of Exercise per Week: Not on file  . Minutes of Exercise per Session: Not on file  Stress:   . Feeling of Stress : Not on file  Social Connections:   . Frequency of Communication with Friends and Family: Not on file  . Frequency of Social Gatherings with Friends and Family: Not on file  . Attends Religious Services: Not on file  . Active Member of Clubs or Organizations: Not on file  . Attends Banker Meetings: Not on file  . Marital Status: Not on file  Intimate Partner Violence:   . Fear of Current or Ex-Partner: Not on file  . Emotionally Abused: Not on file  . Physically Abused: Not on file  . Sexually Abused: Not on file    ROS Review of Systems  Constitutional: Negative for chills, fatigue and fever.  HENT: Positive for postnasal drip. Negative for ear discharge, facial swelling, hearing loss, nosebleeds, sinus pressure and sinus pain.  Eyes: Positive for itching.  Respiratory: Positive for shortness of breath and wheezing. Negative for cough.   Cardiovascular: Negative.   Gastrointestinal: Negative.   Endocrine: Negative.   Genitourinary: Negative.   Musculoskeletal: Negative.   Allergic/Immunologic: Positive for environmental allergies and food allergies.  Neurological: Negative.   Hematological: Negative.   Psychiatric/Behavioral: Negative.     Objective:   Today's Vitals: BP (!) 143/92 (BP Location: Left Arm, Patient Position: Sitting, Cuff Size: Normal)   Pulse 74   Temp (!) 96.3 F (35.7 C) (Oral)   Resp 18   Ht 5\' 5"  (1.651 m)   Wt 190 lb (86.2 kg)   SpO2 98%   BMI 31.62 kg/m   Physical  Exam Vitals and nursing note reviewed.  Constitutional:      General: He is not in acute distress.    Appearance: Normal appearance. He is not ill-appearing.  HENT:     Head: Normocephalic and atraumatic.     Right Ear: Tympanic membrane, ear canal and external ear normal.     Left Ear: Tympanic membrane, ear canal and external ear normal.     Nose: Nose normal.     Mouth/Throat:     Mouth: Mucous membranes are moist.     Pharynx: Oropharynx is clear. No oropharyngeal exudate.   Eyes:     Extraocular Movements: Extraocular movements intact.     Conjunctiva/sclera: Conjunctivae normal.     Pupils: Pupils are equal, round, and reactive to light.  Cardiovascular:     Rate and Rhythm: Normal rate and regular rhythm.     Pulses: Normal pulses.     Heart sounds: Normal heart sounds.  Pulmonary:     Effort: Pulmonary effort is normal.     Breath sounds: Normal breath sounds. No wheezing.  Abdominal:     General: Abdomen is flat.     Palpations: Abdomen is soft.  Musculoskeletal:        General: Normal range of motion.     Cervical back: Normal range of motion and neck supple.  Lymphadenopathy:     Cervical: No cervical adenopathy.  Skin:    General: Skin is warm and dry.  Neurological:     General: No focal deficit present.     Mental Status: He is alert and oriented to person, place, and time.  Psychiatric:        Mood and Affect: Mood normal.        Behavior: Behavior normal.        Thought Content: Thought content normal.        Judgment: Judgment normal.     Assessment & Plan:   Problem List Items Addressed This Visit    None    Visit Diagnoses    Seasonal allergies    -  Primary   Relevant Medications   montelukast (SINGULAIR) 10 MG tablet   cetirizine (ZYRTEC) 10 MG tablet   Mild asthma without complication, unspecified whether persistent       Relevant Medications   montelukast (SINGULAIR) 10 MG tablet   albuterol (VENTOLIN HFA) 108 (90 Base) MCG/ACT inhaler    Gastroesophageal reflux disease without esophagitis       Relevant Medications   omeprazole (PRILOSEC) 40 MG capsule   SOB (shortness of breath)       Relevant Orders   DG Chest 2 View   Elevated blood pressure reading in office without diagnosis of hypertension          Outpatient Encounter Medications  as of 07/16/2020  Medication Sig  . oxyCODONE-acetaminophen (PERCOCET/ROXICET) 5-325 MG tablet Take 1-2 tablets by mouth every 4 (four) hours as needed for severe pain.  . [DISCONTINUED] omeprazole (PRILOSEC) 20 MG capsule Take 20 mg by mouth daily.  Marland Kitchen albuterol (VENTOLIN HFA) 108 (90 Base) MCG/ACT inhaler Inhale 2 puffs into the lungs every 6 (six) hours as needed for wheezing or shortness of breath.  . cetirizine (ZYRTEC) 10 MG tablet Take 1 tablet (10 mg total) by mouth daily.  . montelukast (SINGULAIR) 10 MG tablet Take 1 tablet (10 mg total) by mouth at bedtime.  Marland Kitchen omeprazole (PRILOSEC) 40 MG capsule Take 1 capsule (40 mg total) by mouth daily.  . [DISCONTINUED] cetirizine (ZYRTEC) 10 MG tablet Take 10 mg by mouth daily.  . [DISCONTINUED] ondansetron (ZOFRAN ODT) 4 MG disintegrating tablet Take 1 tablet (4 mg total) by mouth every 8 (eight) hours as needed for nausea or vomiting.  . [DISCONTINUED] tamsulosin (FLOMAX) 0.4 MG CAPS capsule Take 1 capsule (0.4 mg total) by mouth daily.   No facility-administered encounter medications on file as of 07/16/2020.  1. Seasonal allergies Patient education given Trial singulair; allergy avoidance - montelukast (SINGULAIR) 10 MG tablet; Take 1 tablet (10 mg total) by mouth at bedtime.  Dispense: 30 tablet; Refill: 3 - cetirizine (ZYRTEC) 10 MG tablet; Take 1 tablet (10 mg total) by mouth daily.  Dispense: 30 tablet; Refill: 2  2. Mild asthma without complication, unspecified whether persistent Patient education given  - montelukast (SINGULAIR) 10 MG tablet; Take 1 tablet (10 mg total) by mouth at bedtime.  Dispense: 30 tablet; Refill: 3 -  albuterol (VENTOLIN HFA) 108 (90 Base) MCG/ACT inhaler; Inhale 2 puffs into the lungs every 6 (six) hours as needed for wheezing or shortness of breath.  Dispense: 8 g; Refill: 0  3. Gastroesophageal reflux disease without esophagitis Continue current regimen - omeprazole (PRILOSEC) 40 MG capsule; Take 1 capsule (40 mg total) by mouth daily.  Dispense: 30 capsule; Refill: 3  4. SOB (shortness of breath)  - DG Chest 2 View; Future  5. Elevated blood pressure reading in office without diagnosis of hypertension Patient education given. Encouraged patient to check BP at home, keep written log, bring log to next office visit.  Appt made for patient to establish care with Dr. Earlene Plater   I have reviewed the patient's medical history (PMH, PSH, Social History, Family History, Medications, and allergies) , and have been updated if relevant. I spent 30 minutes reviewing chart and  face to face time with patient.     Follow-up: Return if symptoms worsen or fail to improve.   Kasandra Knudsen Mayers, PA-C

## 2020-07-20 ENCOUNTER — Telehealth: Payer: Self-pay | Admitting: *Deleted

## 2020-07-20 NOTE — Telephone Encounter (Signed)
Patient verified DOB Patient is aware of asthma being noted on his xray. Patient advised to continue with current regimen and to makes notes regarding how often he is having to use his rescue inhaler. Patient instructed to bring the information to his FU with PCP. Patient is aware of the MMU being available to him in between his next PCP appointment.

## 2020-07-20 NOTE — Telephone Encounter (Signed)
-----   Message from [name], [place] sent at 07/20/2020  9:06 AM EST ----- Please call patient and let him know that his xray did show some changes that speak to a chronic airway disease (asthma) .  Continue the continue treatment plan we discussed, making sure to keep track of how often he has to use the rescue inhaler.  Please encourage him  bring this information with him to his follow up with Dr. Earlene Plater.  He is free to follow up in the mobile unit before his appt with her if needed

## 2020-10-05 ENCOUNTER — Other Ambulatory Visit: Payer: Self-pay

## 2020-10-05 ENCOUNTER — Ambulatory Visit
Admission: EM | Admit: 2020-10-05 | Discharge: 2020-10-05 | Disposition: A | Discharge status: Home / Self Care | Payer: BLUE CROSS/BLUE SHIELD | Attending: Emergency Medicine | Admitting: Emergency Medicine

## 2020-10-05 ENCOUNTER — Encounter: Payer: Self-pay | Admitting: Emergency Medicine

## 2020-10-05 DIAGNOSIS — R1011 Right upper quadrant pain: Secondary | ICD-10-CM

## 2020-10-05 NOTE — Discharge Instructions (Signed)
Will call you tomorrow with blood results. Follow food recommendations attached. Go to ER for severe pain, vomiting, fever, or yellow eyes/skin.

## 2020-10-05 NOTE — ED Provider Notes (Signed)
[facility]    CSN: [id number] Arrival date & time: 10/05/20  1537      History   Chief Complaint Chief Complaint  Patient presents with  . Abdominal Pain    HPI Matthew Mcpherson is a 45 y.o. male  With history as below presenting for RUQ pain that radiates to back x 3 weeks.  States is become constant since Saturday.  Previously worsened after eating.  Does eat fatty foods.  No history of gallbladder issues, abdominopelvic surgery.  No known family history of gallbladder issues.  No liver disease, alcohol use.  Does admit to nausea: Denies vomiting, fever.  History reviewed. No pertinent past medical history.  Patient Active Problem List   Diagnosis Date Noted  . Seasonal allergies 07/16/2020  . Gastroesophageal reflux disease without esophagitis 07/16/2020  . SOB (shortness of breath) 07/16/2020  . Elevated blood pressure reading in office without diagnosis of hypertension 07/16/2020  . Mild asthma without complication 07/16/2020    History reviewed. No pertinent surgical history.     Home Medications    Prior to Admission medications   Medication Sig Start Date End Date Taking? Authorizing Provider  albuterol (VENTOLIN HFA) 108 (90 Base) MCG/ACT inhaler Inhale 2 puffs into the lungs every 6 (six) hours as needed for wheezing or shortness of breath. 07/16/20   Mayers, Cari S, PA-C  cetirizine (ZYRTEC) 10 MG tablet Take 1 tablet (10 mg total) by mouth daily. 07/16/20   Mayers, Cari S, PA-C  montelukast (SINGULAIR) 10 MG tablet Take 1 tablet (10 mg total) by mouth at bedtime. 07/16/20   Mayers, Cari S, PA-C  omeprazole (PRILOSEC) 40 MG capsule Take 1 capsule (40 mg total) by mouth daily. 07/16/20   Mayers, Cari S, PA-C  oxyCODONE-acetaminophen (PERCOCET/ROXICET) 5-325 MG tablet Take 1-2 tablets by mouth every 4 (four) hours as needed for severe pain. 10/09/19   Gailen Shelter, PA    Family History History reviewed. No pertinent family history.  Social  History Social History   Tobacco Use  . Smoking status: Never Smoker  . Smokeless tobacco: Never Used  Vaping Use  . Vaping Use: Never used  Substance Use Topics  . Alcohol use: Not Currently  . Drug use: Never     Allergies   Peanut-containing drug products   Review of Systems Review of Systems  Constitutional: Negative for fatigue and fever.  Respiratory: Negative for cough and shortness of breath.   Cardiovascular: Negative for chest pain and palpitations.  Gastrointestinal: Positive for abdominal pain and nausea. Negative for blood in stool, constipation, diarrhea and vomiting.  Musculoskeletal: Negative for arthralgias and myalgias.  Skin: Negative for rash and wound.  Neurological: Negative for speech difficulty and headaches.  All other systems reviewed and are negative.    Physical Exam Triage Vital Signs ED Triage Vitals  Enc Vitals Group     BP 10/05/20 1713 129/86     Pulse Rate 10/05/20 1713 97     Resp 10/05/20 1713 16     Temp 10/05/20 1713 98.5 F (36.9 C)     Temp Source 10/05/20 1713 Oral     SpO2 10/05/20 1713 98 %     Weight --      Height --      Head Circumference --      Peak Flow --      Pain Score 10/05/20 1651 8     Pain Loc --      Pain Edu? --  Excl. in GC? --    No data found.  Updated Vital Signs BP 129/86 (BP Location: Right Arm)   Pulse 97   Temp 98.5 F (36.9 C) (Oral)   Resp 16   SpO2 98%   Visual Acuity Right Eye Distance:   Left Eye Distance:   Bilateral Distance:    Right Eye Near:   Left Eye Near:    Bilateral Near:     Physical Exam Constitutional:      General: He is not in acute distress. HENT:     Head: Normocephalic and atraumatic.  Eyes:     General: No scleral icterus.    Pupils: Pupils are equal, round, and reactive to light.  Cardiovascular:     Rate and Rhythm: Normal rate.  Pulmonary:     Effort: Pulmonary effort is normal. No respiratory distress.     Breath sounds: No wheezing.   Abdominal:     General: Bowel sounds are normal.     Palpations: Abdomen is soft. There is no hepatomegaly or splenomegaly.     Tenderness: There is abdominal tenderness in the right upper quadrant. There is guarding. There is no rebound. Positive signs include Murphy's sign. Negative signs include Rovsing's sign and McBurney's sign.     Hernia: No hernia is present.     Comments: Voluntary guarding  Skin:    Coloration: Skin is not jaundiced or pale.  Neurological:     Mental Status: He is alert and oriented to person, place, and time.      UC Treatments / Results  Labs (all labs ordered are listed, but only abnormal results are displayed) Labs Reviewed  COMPREHENSIVE METABOLIC PANEL  CBC WITH DIFFERENTIAL/PLATELET  LIPASE  LIPID PANEL  AMYLASE    EKG   Radiology No results found.  Procedures Procedures (including critical care time)  Medications Ordered in UC Medications - No data to display  Initial Impression / Assessment and Plan / UC Course  I have reviewed the triage vital signs and the nursing notes.  Pertinent labs & imaging results that were available during my care of the patient were reviewed by me and considered in my medical decision making (see chart for details).     Patient febrile, nontoxic in office today.  Likely cholelithiasis, ?  Acute cholecystitis; will obtain basic lab work, follow-up with surgery for further evaluation management.  Return precautions discussed, pt verbalized understanding and is agreeable to plan. Final Clinical Impressions(s) / UC Diagnoses   Final diagnoses:  RUQ pain     Discharge Instructions     Will call you tomorrow with blood results. Follow food recommendations attached. Go to ER for severe pain, vomiting, fever, or yellow eyes/skin.    ED Prescriptions    None     PDMP not reviewed this encounter.   Hall[place]Potvin, [place], [place] 10/05/20 1811

## 2020-10-05 NOTE — ED Triage Notes (Signed)
Pt c/o constant abd pain since Saturday but has had abd pain x3 weeks  Taking OTC Acetaminophen w/temp relief.   Denies f/v/n/d, urinary sx

## 2020-10-06 LAB — LIPID PANEL
Chol/HDL Ratio: 5.2 ratio — ABNORMAL HIGH (ref 0.0–5.0)
Cholesterol, Total: 193 mg/dL (ref 100–199)
HDL: 37 mg/dL — ABNORMAL LOW (ref >39)
LDL Chol Calc (NIH): 111 mg/dL — ABNORMAL HIGH (ref 0–99)
Triglycerides: 259 mg/dL — ABNORMAL HIGH (ref 0–149)
VLDL Cholesterol Cal: 45 mg/dL — ABNORMAL HIGH (ref 5–40)

## 2020-10-06 LAB — CBC WITH DIFFERENTIAL/PLATELET
Basophils Absolute: 0 10*3/uL (ref 0.0–0.2)
Basos: 0 %
EOS (ABSOLUTE): 0.1 10*3/uL (ref 0.0–0.4)
Eos: 1 %
Hematocrit: 46.6 % (ref 37.5–51.0)
Hemoglobin: 16 g/dL (ref 13.0–17.7)
Immature Grans (Abs): 0 10*3/uL (ref 0.0–0.1)
Immature Granulocytes: 0 %
Lymphocytes Absolute: 2.8 10*3/uL (ref 0.7–3.1)
Lymphs: 34 %
MCH: 29.7 pg (ref 26.6–33.0)
MCHC: 34.3 g/dL (ref 31.5–35.7)
MCV: 87 fL (ref 79–97)
Monocytes Absolute: 0.6 10*3/uL (ref 0.1–0.9)
Monocytes: 7 %
Neutrophils Absolute: 4.8 10*3/uL (ref 1.4–7.0)
Neutrophils: 58 %
Platelets: 219 10*3/uL (ref 150–450)
RBC: 5.38 x10E6/uL (ref 4.14–5.80)
RDW: 12.7 % (ref 11.6–15.4)
WBC: 8.4 10*3/uL (ref 3.4–10.8)

## 2020-10-06 LAB — COMPREHENSIVE METABOLIC PANEL
ALT: 44 IU/L (ref 0–44)
AST: 18 IU/L (ref 0–40)
Albumin/Globulin Ratio: 1.8 (ref 1.2–2.2)
Albumin: 4.6 g/dL (ref 4.0–5.0)
Alkaline Phosphatase: 65 IU/L (ref 44–121)
BUN/Creatinine Ratio: 14 (ref 9–20)
BUN: 11 mg/dL (ref 6–24)
Bilirubin Total: 0.7 mg/dL (ref 0.0–1.2)
CO2: 26 mmol/L (ref 20–29)
Calcium: 9.4 mg/dL (ref 8.7–10.2)
Chloride: 106 mmol/L (ref 96–106)
Creatinine, Ser: 0.76 mg/dL (ref 0.76–1.27)
GFR calc Af Amer: 129 mL/min/{1.73_m2} (ref >59)
GFR calc non Af Amer: 112 mL/min/{1.73_m2} (ref >59)
Globulin, Total: 2.6 g/dL (ref 1.5–4.5)
Glucose: 86 mg/dL (ref 65–99)
Potassium: 4.2 mmol/L (ref 3.5–5.2)
Sodium: 144 mmol/L (ref 134–144)
Total Protein: 7.2 g/dL (ref 6.0–8.5)

## 2020-10-06 LAB — AMYLASE: Amylase: 66 U/L (ref 31–110)

## 2020-10-06 LAB — LIPASE: Lipase: 27 U/L (ref 13–78)

## 2020-11-04 ENCOUNTER — Emergency Department (HOSPITAL_COMMUNITY): Payer: BLUE CROSS/BLUE SHIELD

## 2020-11-04 ENCOUNTER — Emergency Department (HOSPITAL_COMMUNITY)
Admission: EM | Admit: 2020-11-04 | Discharge: 2020-11-04 | Disposition: A | Discharge status: Home / Self Care | Payer: BLUE CROSS/BLUE SHIELD | Attending: Emergency Medicine | Admitting: Emergency Medicine

## 2020-11-04 ENCOUNTER — Encounter (HOSPITAL_COMMUNITY): Payer: Self-pay | Admitting: Emergency Medicine

## 2020-11-04 DIAGNOSIS — R1011 Right upper quadrant pain: Secondary | ICD-10-CM | POA: Diagnosis not present

## 2020-11-04 DIAGNOSIS — R93429 Abnormal radiologic findings on diagnostic imaging of unspecified kidney: Secondary | ICD-10-CM | POA: Diagnosis not present

## 2020-11-04 DIAGNOSIS — I10 Essential (primary) hypertension: Secondary | ICD-10-CM | POA: Diagnosis not present

## 2020-11-04 DIAGNOSIS — J45909 Unspecified asthma, uncomplicated: Secondary | ICD-10-CM | POA: Diagnosis not present

## 2020-11-04 DIAGNOSIS — Z91010 Allergy to peanuts: Secondary | ICD-10-CM | POA: Insufficient documentation

## 2020-11-04 DIAGNOSIS — K219 Gastro-esophageal reflux disease without esophagitis: Secondary | ICD-10-CM | POA: Insufficient documentation

## 2020-11-04 LAB — CBC
HCT: 43.9 % (ref 39.0–52.0)
Hemoglobin: 15.6 g/dL (ref 13.0–17.0)
MCH: 30.8 pg (ref 26.0–34.0)
MCHC: 35.5 g/dL (ref 30.0–36.0)
MCV: 86.6 fL (ref 80.0–100.0)
Platelets: 203 10*3/uL (ref 150–400)
RBC: 5.07 MIL/uL (ref 4.22–5.81)
RDW: 12.4 % (ref 11.5–15.5)
WBC: 6.6 10*3/uL (ref 4.0–10.5)
nRBC: 0 % (ref 0.0–0.2)

## 2020-11-04 LAB — COMPREHENSIVE METABOLIC PANEL
ALT: 44 U/L (ref 0–44)
AST: 20 U/L (ref 15–41)
Albumin: 3.9 g/dL (ref 3.5–5.0)
Alkaline Phosphatase: 48 U/L (ref 38–126)
Anion gap: 10 (ref 5–15)
BUN: 12 mg/dL (ref 6–20)
CO2: 26 mmol/L (ref 22–32)
Calcium: 8.9 mg/dL (ref 8.9–10.3)
Chloride: 102 mmol/L (ref 98–111)
Creatinine, Ser: 0.94 mg/dL (ref 0.61–1.24)
GFR, Estimated: >60 mL/min (ref >60)
Glucose, Bld: 117 mg/dL — ABNORMAL HIGH (ref 70–99)
Potassium: 4.1 mmol/L (ref 3.5–5.1)
Sodium: 138 mmol/L (ref 135–145)
Total Bilirubin: 1.2 mg/dL (ref 0.3–1.2)
Total Protein: 6.9 g/dL (ref 6.5–8.1)

## 2020-11-04 LAB — URINALYSIS, ROUTINE W REFLEX MICROSCOPIC
Bilirubin Urine: NEGATIVE
Glucose, UA: NEGATIVE mg/dL
Hgb urine dipstick: NEGATIVE
Ketones, ur: NEGATIVE mg/dL
Leukocytes,Ua: NEGATIVE
Nitrite: NEGATIVE
Protein, ur: NEGATIVE mg/dL
Specific Gravity, Urine: 1.016 (ref 1.005–1.030)
pH: 7 (ref 5.0–8.0)

## 2020-11-04 LAB — LIPASE, BLOOD: Lipase: 29 U/L (ref 11–51)

## 2020-11-04 MED ORDER — HYDROCODONE-ACETAMINOPHEN 5-325 MG PO TABS: 1.0000 | ORAL_TABLET | ORAL | 0 refills | Status: AC | PRN

## 2020-11-04 MED ORDER — HYDROMORPHONE HCL 1 MG/ML IJ SOLN
1.0000 mg | Freq: Once | INTRAMUSCULAR | Status: AC
  Administered 2020-11-04: 1 mg via INTRAMUSCULAR
  Filled 2020-11-04: qty 1

## 2020-11-04 MED ORDER — IOHEXOL 300 MG/ML  SOLN
100.0000 mL | Freq: Once | INTRAMUSCULAR | Status: AC | PRN
  Administered 2020-11-04: 100 mL via INTRAVENOUS

## 2020-11-04 MED ORDER — PREDNISONE 10 MG (21) PO TBPK: ORAL_TABLET | Freq: Every day | ORAL | 0 refills | Status: DC

## 2020-11-04 NOTE — ED Triage Notes (Signed)
Patient complains of RUQ abdominal pain, seen or same in January and referred to general surgeon but did not go because patient states pain was not severe enough. Pain has now become more severe.

## 2020-11-04 NOTE — Discharge Instructions (Addendum)
You were evaluated in the Emergency Department and after careful evaluation, we did not find any emergent condition requiring admission or further testing in the hospital.  Your CT scan showed a questionable kidney stone that is not obstructing or potentially something more. Please make sure to follow up with the Urologist whose contact information I have provided in your discharge paperwork to have this further worked up. Please make sure to follow up with your primary care doctor as well.   Take the pain medicine as needed from severe pain.   Please return to the Emergency Department if you experience any worsening of your condition.    Thank you for allowing [place] to be a part of your care.

## 2020-11-04 NOTE — ED Provider Notes (Signed)
[facility]   CSN: [id number] Arrival date & time: 11/04/20  1136     History Chief Complaint  Patient presents with  . Abdominal Pain    Matthew Mcpherson is a 45 y.o. male.  HPI 45 year old male with a history of GERD, asthma, seasonal allergies presents to the ER with complaints of right upper quadrant pain.  Patient patient reports that the pain can be sharp and stabbing, sometimes radiating straight to the back.  He says this pain has been ongoing intermittently for the last several weeks, however this morning I got unbearable thus he decided to come to the ER.  Per chart review and patient report, patient was seen over at urgent care several weeks ago at the end of January.  He was referred to general surgeon, they suspected cholecystitis as he had positive Murphy sign.  Labwork was reassuring.  He states he has a an appointment with his PCP on Monday, however the pain this morning so terrible that he sought care in the ER.  He reports this pain started after eating a heavy breakfast, which included sausage.  He states that sometimes even when he drives, the vibration will cause pain.  He denies any chest pain, shortness of breath, syncope, dizziness.  Pain seems to subside on its own.  States he is virtually asymptomatic currently in the ER.  Denies any nausea or vomiting, no fevers or chills, no dysuria, hematuria, bowel movements normal.  No prior history of gallstones, though he does endorse a history of kidney stones.  States this feels different than his kidney stones.    History reviewed. No pertinent past medical history.  Patient Active Problem List   Diagnosis Date Noted  . Seasonal allergies 07/16/2020  . Gastroesophageal reflux disease without esophagitis 07/16/2020  . SOB (shortness of breath) 07/16/2020  . Elevated blood pressure reading in office without diagnosis of hypertension 07/16/2020  . Mild asthma without  complication 07/16/2020    History reviewed. No pertinent surgical history.     No family history on file.  Social History   Tobacco Use  . Smoking status: Never Smoker  . Smokeless tobacco: Never Used  Vaping Use  . Vaping Use: Never used  Substance Use Topics  . Alcohol use: Not Currently  . Drug use: Never    Home Medications Prior to Admission medications   Medication Sig Start Date End Date Taking? Authorizing Provider  HYDROcodone-acetaminophen (NORCO/VICODIN) 5-325 MG tablet Take 1 tablet by mouth every 4 (four) hours as needed for up to 3 days. 11/04/20 11/07/20 Yes Mare FerrariBelaya, Carlicia Leavens A, PA-C  albuterol (VENTOLIN HFA) 108 (90 Base) MCG/ACT inhaler Inhale 2 puffs into the lungs every 6 (six) hours as needed for wheezing or shortness of breath. 07/16/20   Mayers, Cari S, PA-C  cetirizine (ZYRTEC) 10 MG tablet Take 1 tablet (10 mg total) by mouth daily. 07/16/20   Mayers, Cari S, PA-C  montelukast (SINGULAIR) 10 MG tablet Take 1 tablet (10 mg total) by mouth at bedtime. 07/16/20   Mayers, Cari S, PA-C  omeprazole (PRILOSEC) 40 MG capsule Take 1 capsule (40 mg total) by mouth daily. 07/16/20   Mayers, Cari S, PA-C  oxyCODONE-acetaminophen (PERCOCET/ROXICET) 5-325 MG tablet Take 1-2 tablets by mouth every 4 (four) hours as needed for severe pain. 10/09/19   Gailen ShelterFondaw, Wylder S, PA    Allergies    Peanut-containing drug products  Review of Systems   Review of Systems  Constitutional: Negative  for chills and fever.  HENT: Negative for ear pain and sore throat.   Eyes: Negative for pain and visual disturbance.  Respiratory: Negative for cough and shortness of breath.   Cardiovascular: Negative for chest pain and palpitations.  Gastrointestinal: Positive for abdominal pain. Negative for vomiting.  Genitourinary: Negative for dysuria and hematuria.  Musculoskeletal: Negative for arthralgias and back pain.  Skin: Negative for color change and rash.  Neurological: Negative for seizures and  syncope.  All other systems reviewed and are negative.   Physical Exam Updated Vital Signs BP 140/86   Pulse 66   Temp 98.6 F (37 C) (Oral)   Resp 18   SpO2 100%   Physical Exam Vitals and nursing note reviewed.  Constitutional:      General: He is not in acute distress.    Appearance: He is well-developed and well-nourished. He is not ill-appearing, toxic-appearing or diaphoretic.  HENT:     Head: Normocephalic and atraumatic.  Eyes:     Conjunctiva/sclera: Conjunctivae normal.  Cardiovascular:     Rate and Rhythm: Normal rate and regular rhythm.     Heart sounds: No murmur heard.   Pulmonary:     Effort: Pulmonary effort is normal. No respiratory distress.     Breath sounds: Normal breath sounds.  Abdominal:     General: Abdomen is flat.     Palpations: Abdomen is soft.     Tenderness: There is no abdominal tenderness. There is no right CVA tenderness or left CVA tenderness. Negative signs include Murphy's sign, Rovsing's sign, McBurney's sign and psoas sign.     Hernia: No hernia is present.  Musculoskeletal:        General: No edema.     Cervical back: Neck supple.  Skin:    General: Skin is warm and dry.  Neurological:     General: No focal deficit present.     Mental Status: He is alert.  Psychiatric:        Mood and Affect: Mood and affect and mood normal.        Behavior: Behavior normal.     ED Results / Procedures / Treatments   Labs (all labs ordered are listed, but only abnormal results are displayed) Labs Reviewed  COMPREHENSIVE METABOLIC PANEL - Abnormal; Notable for the following components:      Result Value   Glucose, Bld 117 (*)    All other components within normal limits  URINALYSIS, ROUTINE W REFLEX MICROSCOPIC - Abnormal; Notable for the following components:   APPearance HAZY (*)    All other components within normal limits  LIPASE, BLOOD  CBC    EKG None  Radiology CT ABDOMEN PELVIS W CONTRAST  Result Date:  11/04/2020 CLINICAL DATA:  Right upper quadrant and back pain EXAM: CT ABDOMEN AND PELVIS WITH CONTRAST TECHNIQUE: Multidetector CT imaging of the abdomen and pelvis was performed using the standard protocol following bolus administration of intravenous contrast. CONTRAST:  OMNIPAQUE IOHEXOL 300 MG/ML  SOLN COMPARISON:  10/09/2019 FINDINGS: Lower chest: No acute pleural or parenchymal lung disease. Hepatobiliary: No focal liver abnormality is seen. No gallstones, gallbladder wall thickening, or biliary dilatation. Pancreas: Unremarkable. No pancreatic ductal dilatation or surrounding inflammatory changes. Spleen: Normal in size without focal abnormality. Adrenals/Urinary Tract: The kidneys enhance normally and symmetrically. No evidence of renal calculus or obstructive uropathy. The left ureter is unremarkable. Within the mid right ureter reference image 59/7, there is a 3 mm area of increased attenuation within the mid  right ureteral lumen. While this could reflect a nonobstructing calculus, retrograde ureteroscopy may be useful to exclude uroepithelial lesion. Stable 6 mm left adrenal adenoma. Right adrenals unremarkable. The bladder is grossly normal. Stomach/Bowel: No bowel obstruction or ileus. Normal appendix right lower quadrant. No bowel wall thickening or inflammatory change. Vascular/Lymphatic: No significant vascular findings are present. No enlarged abdominal or pelvic lymph nodes. Reproductive: Prostate is unremarkable. Other: No free fluid or free gas.  No abdominal wall hernia. Musculoskeletal: No acute or destructive bony lesions. Reconstructed images demonstrate no additional findings. IMPRESSION: 1. No acute intra-abdominal or intrapelvic process. 2. Nonspecific hyperattenuation within the mid right ureteral lumen, measuring 3 mm. This could reflect a nonobstructing calculus, though uroepithelial lesion cannot be excluded. Please correlate with urinalysis. If further evaluation is desired,  ureteroscopy may be useful. Electronically Signed   By: Sharlet Salina M.D.   On: 11/04/2020 18:18   US Abdomen Limited RUQ (LIVER/GB)  Result Date: 11/04/2020 CLINICAL DATA:  One month of right upper quadrant pain. EXAM: ULTRASOUND ABDOMEN LIMITED RIGHT UPPER QUADRANT COMPARISON:  HIDA scan October 10, 2013 and CT abdomen pelvis October 09, 2019. FINDINGS: Gallbladder: No gallstones or wall thickening visualized. No sonographic Murphy sign noted by sonographer. Common bile duct: Diameter: 3 mm Liver: No focal lesion identified. Heterogeneously increased echogenicity of the hepatic parenchyma. Portal vein is patent on color Doppler imaging with normal direction of blood flow towards the liver. Other: None. IMPRESSION: Heterogeneously increased echogenicity of the hepatic parenchyma. This is a nonspecific finding but is most commonly seen with fatty infiltration of the liver. There are no obvious focal liver lesions. Electronically Signed   By: Maudry Mayhew MD   On: 11/04/2020 16:14    Procedures Procedures   Medications Ordered in ED Medications  HYDROmorphone (DILAUDID) injection 1 mg (1 mg Intramuscular Given 11/04/20 1516)  iohexol (OMNIPAQUE) 300 MG/ML solution 100 mL (100 mLs Intravenous Contrast Given 11/04/20 1731)    ED Course  I have reviewed the triage vital signs and the nursing notes.  Pertinent labs & imaging results that were available during my care of the patient were reviewed by me and considered in my medical decision making (see chart for details).    MDM Rules/Calculators/A&P                         45 year old male who presents to the ER with intermittent right upper quadrant pain. On arrival, he is alert, oriented, nontoxic-appearing, no acute distress, resting comfortably in the ER bed.  Physical exam with no significant abdominal tenderness, he has a negative Murphy's sign at present.  Vitals reassuring, afebrile.  Mildly hypertensive with a blood pressure  145/78.  DDx includes cholecystitis/cholelithiasis/choledocholithiasis, GERD, dissection  Given reoccurring bouts of pain, plan to order right upper quadrant ultrasound.  Labs ordered in triage, reviewed by me.  CBC, CMP, lipase unremarkable.  UA without evidence of blood or UTI.  Patient will be given IM Dilaudid for pain control.  Right upper quadrant ultrasound with increased echogenicity of the hepatic parenchyma, likely fatty liver disease.  No evidence of gallstones.  I discussed these findings with the patient, explained that we do not have a direct cause of his symptoms.  Discussed risk versus benefit of CT imaging, patient would like to proceed forward with this today.  I personally reviewed his CT scan, which showed no acute intra-abdominal/intrapelvic process, however there was a nonspecific hyperattenuation in the mid right ureteral lumen, measuring  3 mm.  Could represent nonobstructing calculus, however uroepithelial lesion cannot be excluded.  His UA does not show any evidence of hematuria.  Patient has been asymptomatic here in the ER, pain is well controlled.  I discussed results of his scans with him, he is overall reassured.  I stressed that he needs to follow-up with a urology for evaluation of the hyperattenuation seen on CT scan. Also encouraged to follow up with PCP as planned on Monday.  Will prescribe short course of Norco for breakthrough pain.  Low suspicion for dissection at this time.  We discussed return precautions.  At this stage in the ED course, patient's medical screening stable for discharge.  Case discussed with Dr. Stevie Kern who is agreeable to the above plan and disposition  Final Clinical Impression(s) / ED Diagnoses Final diagnoses:  RUQ abdominal pain  Abnormal CT scan, kidney    Rx / DC Orders ED Discharge Orders         Ordered    HYDROcodone-acetaminophen (NORCO/VICODIN) 5-325 MG tablet  Every 4 hours PRN        11/04/20 1823    predniSONE  (STERAPRED UNI-PAK 21 TAB) 10 MG (21) TBPK tablet  Daily,   Status:  Discontinued        11/04/20 1832           Mare Ferrari, PA-C 11/04/20 1837    Milagros Loll, MD 11/06/20 [phone]

## 2020-11-09 ENCOUNTER — Telehealth: Payer: Self-pay | Admitting: Internal Medicine

## 2020-11-16 ENCOUNTER — Telehealth (INDEPENDENT_AMBULATORY_CARE_PROVIDER_SITE_OTHER): Payer: BLUE CROSS/BLUE SHIELD | Admitting: Family

## 2020-11-16 ENCOUNTER — Other Ambulatory Visit: Payer: Self-pay

## 2020-11-16 DIAGNOSIS — N200 Calculus of kidney: Secondary | ICD-10-CM

## 2020-11-16 DIAGNOSIS — Z7689 Persons encountering health services in other specified circumstances: Secondary | ICD-10-CM | POA: Diagnosis not present

## 2020-11-16 DIAGNOSIS — K219 Gastro-esophageal reflux disease without esophagitis: Secondary | ICD-10-CM | POA: Diagnosis not present

## 2020-11-16 DIAGNOSIS — J302 Other seasonal allergic rhinitis: Secondary | ICD-10-CM

## 2020-11-16 MED ORDER — CETIRIZINE HCL 10 MG PO TABS
10.0000 mg | ORAL_TABLET | Freq: Every day | ORAL | 2 refills | Status: DC
Start: 2020-11-16 — End: 2021-02-23

## 2020-11-16 MED ORDER — MONTELUKAST SODIUM 10 MG PO TABS: 10.0000 mg | ORAL_TABLET | Freq: Every day | ORAL | 3 refills | Status: DC

## 2020-11-16 MED ORDER — GUAIFENESIN ER 600 MG PO TB12: 600.0000 mg | ORAL_TABLET | Freq: Two times a day (BID) | ORAL | 0 refills | Status: AC | PRN

## 2020-11-16 MED ORDER — OMEPRAZOLE 40 MG PO CPDR: 40.0000 mg | DELAYED_RELEASE_CAPSULE | Freq: Every day | ORAL | 3 refills | Status: DC

## 2020-11-16 NOTE — Progress Notes (Signed)
Virtual Visit via Telephone Note  I connected with Matthew Mcpherson, on 11/16/2020 at 1:30 PM by telephone due to the COVID-19 pandemic and verified that I am speaking with the correct person using two identifiers.  Due to current restrictions/limitations of in-office visits due to the COVID-19 pandemic, this scheduled clinical appointment was converted to a telehealth visit.   Consent: I discussed the limitations, risks, security and privacy concerns of performing an evaluation and management service by telephone and the availability of in person appointments. I also discussed with the patient that there may be a patient responsible charge related to this service. The patient expressed understanding and agreed to proceed.  Location of Patient: Home  Location of Provider:  Primary Care at [facility]  Persons participating in Telemedicine visit: Evelina Bucy, NP Margorie John, CMA  History of Present Illness: Matthew Mcpherson is a 45 year-old male who presents to establish care. PMH significant for mild asthma without complication, gastroesophageal reflux disease without esophagitis, seasonal allergies, shortness of breath, and elevated blood pressure reading in office without diagnosis of hypertension.   Current issues and/or concerns: Would like refills on Zyrtec and Singulair for allergies and Omeprazole for acid reflux. Reports increased mucus in bilateral nostrils which drain down back of throat. Visit at [facility] on 11/04/2020 for RUQ pain related to kidney stone on CT scan. Says he as referred to Urology at that time but that the particular office refused his health insurance. Would like repeat Urology referral. Today feeling well and no longer having RUQ pain. Denies blood in urine.   No past medical history on file. Allergies  Allergen Reactions  . Peanut-Containing Drug Products Rash    Patient denies allergen at this time. Patient shares he  eats peanuts frequently and has no reaction.    Current Outpatient Medications on File Prior to Visit  Medication Sig Dispense Refill  . albuterol (VENTOLIN HFA) 108 (90 Base) MCG/ACT inhaler Inhale 2 puffs into the lungs every 6 (six) hours as needed for wheezing or shortness of breath. 8 g 0  . cetirizine (ZYRTEC) 10 MG tablet Take 1 tablet (10 mg total) by mouth daily. 30 tablet 2  . montelukast (SINGULAIR) 10 MG tablet Take 1 tablet (10 mg total) by mouth at bedtime. 30 tablet 3  . omeprazole (PRILOSEC) 40 MG capsule Take 1 capsule (40 mg total) by mouth daily. 30 capsule 3  . oxyCODONE-acetaminophen (PERCOCET/ROXICET) 5-325 MG tablet Take 1-2 tablets by mouth every 4 (four) hours as needed for severe pain. 10 tablet 0   No current facility-administered medications on file prior to visit.    Observations/Objective: Alert and oriented x 3. Not in acute distress. Physical examination not completed as this is a telemedicine visit.  Assessment and Plan: 1. Encounter to establish care: - Patient presents today to establish care.  - Return for annual physical examination, labs, and health maintenance. Arrive fasting meaning having had no food and/or nothing to drink for at least 8 hours prior to appointment.  Please take scheduled medications as normal.  2. Seasonal allergies: - Continue Montelukast and Cetirizine as prescribed.  - Begin Guaifenesin as prescribed for increased mucus.  - Follow-up with primary provider as needed.  - montelukast (SINGULAIR) 10 MG tablet; Take 1 tablet (10 mg total) by mouth at bedtime.  Dispense: 30 tablet; Refill: 3 - cetirizine (ZYRTEC) 10 MG tablet; Take 1 tablet (10 mg total) by mouth daily.  Dispense: 30 tablet; Refill: 2 -  guaiFENesin (MUCINEX) 600 MG 12 hr tablet; Take 1 tablet (600 mg total) by mouth 2 (two) times daily as needed for up to 14 days for to loosen phlegm.  Dispense: 28 tablet; Refill: 0  3. Gastroesophageal reflux disease without  esophagitis: - Continue Omeprazole as prescribed.  - Follow-up with primary provider as needed.  - omeprazole (PRILOSEC) 40 MG capsule; Take 1 capsule (40 mg total) by mouth daily.  Dispense: 30 capsule; Refill: 3  4. Nephrolithiasis: - Patient reports chronic history of intermittent kidney stones since 2008.  - Visit 11/04/2020 at the [facility] Emergency Department with RUQ pain. CT scan resulted with hyperattenuation without the possibility of excluding uroepithelial lesion.  - Today feeling well and no longer having RUQ pain. Denies blood in the urine. - Referral to Urology for further evaluation and management.  - Follow-up with primary provider as needed.  - Ambulatory referral to Urology   Follow Up Instructions: Return for annual physical exam. Referral to Urology.    Patient was given clear instructions to go to Emergency Department or return to medical center if symptoms don't improve, worsen, or new problems develop.The patient verbalized understanding.  I discussed the assessment and treatment plan with the patient. The patient was provided an opportunity to ask questions and all were answered. The patient agreed with the plan and demonstrated an understanding of the instructions.   The patient was advised to call back or seek an in-person evaluation if the symptoms worsen or if the condition fails to improve as anticipated.   I provided 20 minutes total of non-face-to-face time during this encounter including median intraservice time, reviewing previous notes, labs, imaging, medications, management and patient verbalized understanding.    Rema Fendt, NP  [facility] at [facility] [place], [place] [phone] 11/16/2020, 1:30 PM

## 2020-11-16 NOTE — Progress Notes (Signed)
Establish care

## 2020-11-21 ENCOUNTER — Other Ambulatory Visit: Payer: Self-pay | Admitting: Physician Assistant

## 2020-11-21 DIAGNOSIS — J45909 Unspecified asthma, uncomplicated: Secondary | ICD-10-CM

## 2020-12-21 NOTE — Progress Notes (Signed)
Patient ID: Matthew Mcpherson, male    DOB: 1976/06/13  MRN: [id number]  CC: Annual Physical Exam  Subjective: Matthew Mcpherson is a 45 y.o. male who presents for annual physical exam. His concerns today include:   Endorses intermittent stomach pain for at least 1 year. Thought stomach pain may be related to Singulair. Reports taking Singulair for shortness of breath and not allergies. In the past took Albuterol inhaler for shortness of breath, did well on regimen, not ready to begin on Albuterol inhaler as of yet. Prefers to continue Singulair. Also, consideration of stomach pain related to herbal supplements for gas and bloating. Endorses appointment with Gastroenterology in the past and does not wish to return at this time for stomach pain.   Patient Active Problem List   Diagnosis Date Noted  . Flatulence, eructation and gas pain 12/23/2020  . Right upper quadrant pain 12/23/2020  . Seasonal allergies 07/16/2020  . Gastroesophageal reflux disease without esophagitis 07/16/2020  . SOB (shortness of breath) 07/16/2020  . Elevated blood pressure reading in office without diagnosis of hypertension 07/16/2020  . Mild asthma without complication 07/16/2020  . Laryngopharyngeal reflux 11/06/2015  . Lumbar sprain 06/25/2008  . Unspecified viral infection, in conditions classified elsewhere and of unspecified site 04/08/2008  . Abnormal levels of other serum enzymes 12/24/2007  . Diaphragmatic hernia 12/24/2007  . Abnormal weight gain 12/19/2007  . Constipation 12/19/2007  . Dermatophytosis of groin and perianal area 12/19/2007  . Low back pain 12/19/2007     Current Outpatient Medications on File Prior to Visit  Medication Sig Dispense Refill  . albuterol (VENTOLIN HFA) 108 (90 Base) MCG/ACT inhaler INHALE 2 PUFFS INTO THE LUNGS EVERY 6 HOURS AS NEEDED FOR WHEEZING FOR SHORTNESS OF BREATH 9 g 0  . cetirizine (ZYRTEC) 10 MG tablet Take 1 tablet (10 mg total) by mouth daily. 30 tablet 2  .  montelukast (SINGULAIR) 10 MG tablet Take 1 tablet (10 mg total) by mouth at bedtime. 30 tablet 3  . omeprazole (PRILOSEC) 40 MG capsule Take 1 capsule (40 mg total) by mouth daily. 30 capsule 3   No current facility-administered medications on file prior to visit.    Allergies  Allergen Reactions  . Other Rash  . Peanut-Containing Drug Products Rash    Patient denies allergen at this time. Patient shares he eats peanuts frequently and has no reaction.    Social History   Socioeconomic History  . Marital status: Married    Spouse name: Not on file  . Number of children: Not on file  . Years of education: Not on file  . Highest education level: Not on file  Occupational History  . Not on file  Tobacco Use  . Smoking status: Never Smoker  . Smokeless tobacco: Never Used  Vaping Use  . Vaping Use: Never used  Substance and Sexual Activity  . Alcohol use: Not Currently  . Drug use: Never  . Sexual activity: Not Currently  Other Topics Concern  . Not on file  Social History Narrative  . Not on file   Social Determinants of Health   Financial Resource Strain: Not on file  Food Insecurity: Not on file  Transportation Needs: Not on file  Physical Activity: Not on file  Stress: Not on file  Social Connections: Not on file  Intimate Partner Violence: Not on file    No family history on file.  No past surgical history on file.  ROS: Review of Systems Negative except  as stated above  PHYSICAL EXAM: BP 138/81 (BP Location: Left Arm, Patient Position: Sitting)   Pulse 67   Ht 5\' 5"  (1.651 m)   Wt 194 lb (88 kg)   SpO2 98%   BMI 32.28 kg/m    Wt Readings from Last 3 Encounters:  12/23/20 194 lb (88 kg)  07/16/20 190 lb (86.2 kg)  04/23/18 180 lb (81.6 kg)    Physical Exam Constitutional:      Appearance: He is obese.  HENT:     Head: Normocephalic and atraumatic.     Right Ear: Tympanic membrane, ear canal and external ear normal.     Left Ear: Tympanic  membrane, ear canal and external ear normal.     Nose: Nose normal.     Mouth/Throat:     Mouth: Mucous membranes are moist.     Pharynx: Oropharynx is clear.  Eyes:     Extraocular Movements: Extraocular movements intact.     Conjunctiva/sclera: Conjunctivae normal.     Pupils: Pupils are equal, round, and reactive to light.  Cardiovascular:     Rate and Rhythm: Normal rate and regular rhythm.     Pulses: Normal pulses.     Heart sounds: Normal heart sounds.  Pulmonary:     Effort: Pulmonary effort is normal.     Breath sounds: Normal breath sounds.  Abdominal:     General: Bowel sounds are normal.     Palpations: Abdomen is soft.  Genitourinary:    Comments: Patient declined examination. Musculoskeletal:        General: Normal range of motion.     Cervical back: Normal range of motion and neck supple.  Skin:    General: Skin is warm and dry.     Capillary Refill: Capillary refill takes less than 2 seconds.  Neurological:     General: No focal deficit present.     Mental Status: He is alert and oriented to person, place, and time.  Psychiatric:        Mood and Affect: Mood normal.        Behavior: Behavior normal.    ASSESSMENT AND PLAN: 1. Annual physical exam: - Counseled on 150 minutes of exercise per week as tolerated, healthy eating (including decreased daily intake of saturated fats, cholesterol, added sugars, sodium), STI prevention, and routine healthcare maintenance.  2. Screening for metabolic disorder: - CMP last obtained 11/04/2020.  3. Screening, iron deficiency anemia: - CBC last obtained 11/04/2020.  4. Diabetes mellitus screening: - Hemoglobin A1c to screen for pre-diabetes/diabetes. - Hemoglobin A1c  5. Screening cholesterol level: - Lipid panel to screen for high cholesterol.  - Lipid panel  6. Thyroid disorder screen: - TSH to check thyroid function.  - TSH+T4F+T3Free  7. Need for hepatitis C screening test: - Hepatitis C antibody to screen  for hepatitis C.  - Hepatitis C Antibody  8. Encounter for screening for HIV: - HIV antibody to screen for human immunodeficiency virus.  - HIV antibody (with reflex)  9. Seasonal allergies: - Breathing concerns may be related to worsening allergies.  - Patient declined inhaler. - Continue Montelukast and Cetirizine as prescribed.  - Referral to Allergy for further evaluation and management.   - Ambulatory referral to Allergy  10. Mild asthma without complication, unspecified whether persistent 11. SOB (shortness of breath): - Chronic with history of asthma. - No evidence of respiratory or cardiovascular distress today in office. - Declined Albuterol inhaler at this time. - Continue Montelukast as prescribed. -  Diagnostic chest x-ray on 07/16/2020 resulted some coarsened interstitial changes and airways thickening can be seen in the setting of chronic reactive airways disease in this patient with history of asthma. - Referral to Pulmonology for further evaluation and management.  - Ambulatory referral to Pulmonology  12. Generalized abdominal pain: - Abdominal ultrasound for further evaluation.  - US Abdomen Complete; Future  Patient was given the opportunity to ask questions.  Patient verbalized understanding of the plan and was able to repeat key elements of the plan. Patient was given clear instructions to go to Emergency Department or return to medical center if symptoms don't improve, worsen, or new problems develop.The patient verbalized understanding.   Orders Placed This Encounter  Procedures  . US Abdomen Complete  . Hepatitis C Antibody  . HIV antibody (with reflex)  . TSH+T4F+T3Free  . Lipid panel  . Hemoglobin A1c  . Ambulatory referral to Allergy  . Ambulatory referral to Pulmonology    Follow-up with primary provider as scheduled.  Rema Fendt, NP

## 2020-12-23 ENCOUNTER — Other Ambulatory Visit: Payer: Self-pay

## 2020-12-23 ENCOUNTER — Ambulatory Visit (INDEPENDENT_AMBULATORY_CARE_PROVIDER_SITE_OTHER): Payer: BLUE CROSS/BLUE SHIELD | Admitting: Family

## 2020-12-23 ENCOUNTER — Encounter: Payer: Self-pay | Admitting: Family

## 2020-12-23 VITALS — BP 138/81 | HR 67 | Ht 65.0 in | Wt 194.0 lb

## 2020-12-23 DIAGNOSIS — Z13220 Encounter for screening for lipoid disorders: Secondary | ICD-10-CM

## 2020-12-23 DIAGNOSIS — R1011 Right upper quadrant pain: Secondary | ICD-10-CM | POA: Insufficient documentation

## 2020-12-23 DIAGNOSIS — Z130 Encounter for screening for diseases of the blood and blood-forming organs and certain disorders involving the immune mechanism: Secondary | ICD-10-CM

## 2020-12-23 DIAGNOSIS — Z1329 Encounter for screening for other suspected endocrine disorder: Secondary | ICD-10-CM

## 2020-12-23 DIAGNOSIS — Z13228 Encounter for screening for other metabolic disorders: Secondary | ICD-10-CM | POA: Diagnosis not present

## 2020-12-23 DIAGNOSIS — R141 Gas pain: Secondary | ICD-10-CM | POA: Insufficient documentation

## 2020-12-23 DIAGNOSIS — J302 Other seasonal allergic rhinitis: Secondary | ICD-10-CM

## 2020-12-23 DIAGNOSIS — R142 Eructation: Secondary | ICD-10-CM | POA: Insufficient documentation

## 2020-12-23 DIAGNOSIS — R0602 Shortness of breath: Secondary | ICD-10-CM

## 2020-12-23 DIAGNOSIS — Z131 Encounter for screening for diabetes mellitus: Secondary | ICD-10-CM | POA: Diagnosis not present

## 2020-12-23 DIAGNOSIS — Z0000 Encounter for general adult medical examination without abnormal findings: Secondary | ICD-10-CM | POA: Diagnosis not present

## 2020-12-23 DIAGNOSIS — R1084 Generalized abdominal pain: Secondary | ICD-10-CM

## 2020-12-23 DIAGNOSIS — J45909 Unspecified asthma, uncomplicated: Secondary | ICD-10-CM

## 2020-12-23 DIAGNOSIS — Z1159 Encounter for screening for other viral diseases: Secondary | ICD-10-CM

## 2020-12-23 DIAGNOSIS — Z114 Encounter for screening for human immunodeficiency virus [HIV]: Secondary | ICD-10-CM

## 2020-12-23 NOTE — Progress Notes (Signed)
Physical

## 2020-12-23 NOTE — Patient Instructions (Addendum)
Annual physical exam and labs today.   Referral to Allergy.   Referral to Urology. [facility] ph. # [phone]. Address [address] [place] [address]nd floor.  Follow-up with primary provider as scheduled.  Preventive Care 51-44 Years Old, Male Preventive care refers to lifestyle choices and visits with your health care provider that can promote health and wellness. This includes:  A yearly physical exam. This is also called an annual wellness visit.  Regular dental and eye exams.  Immunizations.  Screening for certain conditions.  Healthy lifestyle choices, such as: ? Eating a healthy diet. ? Getting regular exercise. ? Not using drugs or products that contain nicotine and tobacco. ? Limiting alcohol use. What can I expect for my preventive care visit? Physical exam Your health care provider will check your:  Height and weight. These may be used to calculate your BMI (body mass index). BMI is a measurement that tells if you are at a healthy weight.  Heart rate and blood pressure.  Body temperature.  Skin for abnormal spots. Counseling Your health care provider may ask you questions about your:  Past medical problems.  Family's medical history.  Alcohol, tobacco, and drug use.  Emotional well-being.  Home life and relationship well-being.  Sexual activity.  Diet, exercise, and sleep habits.  Work and work Astronomer.  Access to firearms. What immunizations do I need? Vaccines are usually given at various ages, according to a schedule. Your health care provider will recommend vaccines for you based on your age, medical history, and lifestyle or other factors, such as travel or where you work.   What tests do I need? Blood tests  Lipid and cholesterol levels. These may be checked every 5 years, or more often if you are over 21 years old.  Hepatitis C test.  Hepatitis B test. Screening  Lung cancer screening. You may have this screening every year  starting at age 26 if you have a 30-pack-year history of smoking and currently smoke or have quit within the past 15 years.  Prostate cancer screening. Recommendations will vary depending on your family history and other risks.  Genital exam to check for testicular cancer or hernias.  Colorectal cancer screening. ? All adults should have this screening starting at age 81 and continuing until age 12. ? Your health care provider may recommend screening at age 2 if you are at increased risk. ? You will have tests every 1-10 years, depending on your results and the type of screening test.  Diabetes screening. ? This is done by checking your blood sugar (glucose) after you have not eaten for a while (fasting). ? You may have this done every 1-3 years.  STD (sexually transmitted disease) testing, if you are at risk. Follow these instructions at home: Eating and drinking  Eat a diet that includes fresh fruits and vegetables, whole grains, lean protein, and low-fat dairy products.  Take vitamin and mineral supplements as recommended by your health care provider.  Do not drink alcohol if your health care provider tells you not to drink.  If you drink alcohol: ? Limit how much you have to 0-2 drinks a day. ? Be aware of how much alcohol is in your drink. In the [place] one drink equals one 12 oz bottle of beer (355 mL), one 5 oz glass of wine (148 mL), or one 1 oz glass of hard liquor (44 mL).   Lifestyle  Take daily care of your teeth and gums. Brush your  teeth every morning and night with fluoride toothpaste. Floss one time each day.  Stay active. Exercise for at least 30 minutes 5 or more days each week.  Do not use any products that contain nicotine or tobacco, such as cigarettes, e-cigarettes, and chewing tobacco. If you need help quitting, ask your health care provider.  Do not use drugs.  If you are sexually active, practice safe sex. Use a condom or other form of protection to  prevent STIs (sexually transmitted infections).  If told by your health care provider, take low-dose aspirin daily starting at age 2.  Find healthy ways to cope with stress, such as: ? Meditation, yoga, or listening to music. ? Journaling. ? Talking to a trusted person. ? Spending time with friends and family. Safety  Always wear your seat belt while driving or riding in a vehicle.  Do not drive: ? If you have been drinking alcohol. Do not ride with someone who has been drinking. ? When you are tired or distracted. ? While texting.  Wear a helmet and other protective equipment during sports activities.  If you have firearms in your house, make sure you follow all gun safety procedures. What's next?  Go to your health care provider once a year for an annual wellness visit.  Ask your health care provider how often you should have your eyes and teeth checked.  Stay up to date on all vaccines. This information is not intended to replace advice given to you by your health care provider. Make sure you discuss any questions you have with your health care provider. Document Revised: 05/28/2019 Document Reviewed: 08/23/2018 Elsevier Patient Education  2021 [employer].

## 2020-12-24 LAB — LIPID PANEL
Chol/HDL Ratio: 4.8 ratio (ref 0.0–5.0)
Cholesterol, Total: 169 mg/dL (ref 100–199)
HDL: 35 mg/dL — ABNORMAL LOW (ref >39)
LDL Chol Calc (NIH): 86 mg/dL (ref 0–99)
Triglycerides: 288 mg/dL — ABNORMAL HIGH (ref 0–149)
VLDL Cholesterol Cal: 48 mg/dL — ABNORMAL HIGH (ref 5–40)

## 2020-12-24 LAB — TSH+T4F+T3FREE
Free T4: 1.51 ng/dL (ref 0.82–1.77)
T3, Free: 3.5 pg/mL (ref 2.0–4.4)
TSH: 1.36 u[IU]/mL (ref 0.450–4.500)

## 2020-12-24 LAB — HEPATITIS C ANTIBODY: Hep C Virus Ab: <0.1 s/co ratio (ref 0.0–0.9)

## 2020-12-24 LAB — HIV ANTIBODY (ROUTINE TESTING W REFLEX): HIV Screen 4th Generation wRfx: NONREACTIVE

## 2020-12-24 LAB — HEMOGLOBIN A1C
Est. average glucose Bld gHb Est-mCnc: 120 mg/dL
Hgb A1c MFr Bld: 5.8 % — ABNORMAL HIGH (ref 4.8–5.6)

## 2020-12-24 NOTE — Progress Notes (Signed)
Thyroid normal.   Hepatitis C negative.   HIV negative.  Your hemoglobin A1c is consistent with pre-diabetes. Practice healthy eating habits of fresh fruit and vegetables, lean baked meats such as chicken, fish, and [place]; limit breads, rice, pastas, and desserts; practice regular aerobic exercise (at least 150 minutes a week as tolerated) and will recheck at next visit. Encouraged to have rechecked in 6 months.   Cholesterol higher than expected. High cholesterol may increase risk of heart attack and/or stroke. Consider eating more fruits, vegetables, and lean baked meats such as chicken or fish. Moderate intensity exercise at least 150 minutes as tolerated per week may help as well. Encouraged to have rechecked in 6 months.  The following is for provider reference only: The 10-year ASCVD risk score is: 2.2%   Values used to calculate the score:     Age: 45 years     Sex: Male     Is Non-Hispanic African American: No     Diabetic: No     Tobacco smoker: No     Systolic Blood Pressure: 138 mmHg     Is BP treated: No     HDL Cholesterol: 35 mg/dL     Total Cholesterol: 169 mg/dL

## 2021-01-03 ENCOUNTER — Encounter: Payer: Self-pay | Admitting: Family

## 2021-01-04 ENCOUNTER — Other Ambulatory Visit: Payer: Self-pay | Admitting: Family

## 2021-01-04 DIAGNOSIS — J302 Other seasonal allergic rhinitis: Secondary | ICD-10-CM

## 2021-01-04 DIAGNOSIS — N200 Calculus of kidney: Secondary | ICD-10-CM

## 2021-01-04 NOTE — Telephone Encounter (Signed)
Updated referrals to Urology and Allergy placed with request for [facility] network. Please allow 2 weeks for their offices to contact with appointment details.

## 2021-02-04 ENCOUNTER — Other Ambulatory Visit: Payer: Self-pay

## 2021-02-04 ENCOUNTER — Ambulatory Visit
Admission: EM | Admit: 2021-02-04 | Discharge: 2021-02-04 | Disposition: A | Discharge status: Home / Self Care | Payer: BLUE CROSS/BLUE SHIELD | Attending: Family Medicine | Admitting: Family Medicine

## 2021-02-04 DIAGNOSIS — J3089 Other allergic rhinitis: Secondary | ICD-10-CM

## 2021-02-04 DIAGNOSIS — J4521 Mild intermittent asthma with (acute) exacerbation: Secondary | ICD-10-CM | POA: Diagnosis not present

## 2021-02-04 DIAGNOSIS — J069 Acute upper respiratory infection, unspecified: Secondary | ICD-10-CM | POA: Diagnosis not present

## 2021-02-04 LAB — POCT INFLUENZA A/B
Influenza A, POC: NEGATIVE
Influenza B, POC: NEGATIVE

## 2021-02-04 MED ORDER — FLUTICASONE PROPIONATE 50 MCG/ACT NA SUSP: 1.0000 | Freq: Two times a day (BID) | NASAL | 0 refills | Status: AC

## 2021-02-04 MED ORDER — PREDNISONE 20 MG PO TABS: 40.0000 mg | ORAL_TABLET | Freq: Every day | ORAL | 0 refills | Status: DC

## 2021-02-04 NOTE — ED Provider Notes (Addendum)
[facility]    CSN: [id number] Arrival date & time: 02/04/21  1641      History   Chief Complaint Chief Complaint  Patient presents with  . Cough    HPI Matthew Mcpherson is a 45 y.o. male.   Patient presenting today with 4-day history of cough, nasal congestion.  Denies body aches, fever, chills, chest pain, shortness of breath, abdominal pain, nausea vomiting or diarrhea.  Has been taking some over-the-counter cold and sinus medication with mild temporary relief of his congestion.  States his son tested positive for flu this morning so he is wanting to be tested.  Does have a history of asthma for which he takes albuterol as needed.  Has seasonal allergies for which he takes Zyrtec and Singulair.     Past Medical History:  Diagnosis Date  . Allergy    [employer] 12/20/2020    Patient Active Problem List   Diagnosis Date Noted  . Flatulence, eructation and gas pain 12/23/2020  . Right upper quadrant pain 12/23/2020  . Seasonal allergies 07/16/2020  . Gastroesophageal reflux disease without esophagitis 07/16/2020  . SOB (shortness of breath) 07/16/2020  . Elevated blood pressure reading in office without diagnosis of hypertension 07/16/2020  . Mild asthma without complication 07/16/2020  . Laryngopharyngeal reflux 11/06/2015  . Lumbar sprain 06/25/2008  . Unspecified viral infection, in conditions classified elsewhere and of unspecified site 04/08/2008  . Abnormal levels of other serum enzymes 12/24/2007  . Diaphragmatic hernia 12/24/2007  . Abnormal weight gain 12/19/2007  . Constipation 12/19/2007  . Dermatophytosis of groin and perianal area 12/19/2007  . Low back pain 12/19/2007    History reviewed. No pertinent surgical history.     Home Medications    Prior to Admission medications   Medication Sig Start Date End Date Taking? Authorizing Provider  fluticasone (FLONASE) 50 MCG/ACT nasal spray Place 1 spray into both nostrils in the morning and at  bedtime. 02/04/21  Yes Particia Nearing, PA-C  predniSONE (DELTASONE) 20 MG tablet Take 2 tablets (40 mg total) by mouth daily with breakfast. 02/04/21  Yes Particia Nearing, PA-C  albuterol (VENTOLIN HFA) 108 (90 Base) MCG/ACT inhaler INHALE 2 PUFFS INTO THE LUNGS EVERY 6 HOURS AS NEEDED FOR WHEEZING FOR SHORTNESS OF BREATH 11/27/20   Mayers, Cari S, PA-C  cetirizine (ZYRTEC) 10 MG tablet Take 1 tablet (10 mg total) by mouth daily. 11/16/20   Rema Fendt, NP  montelukast (SINGULAIR) 10 MG tablet Take 1 tablet (10 mg total) by mouth at bedtime. 11/16/20   Rema Fendt, NP  omeprazole (PRILOSEC) 40 MG capsule Take 1 capsule (40 mg total) by mouth daily. 11/16/20   Rema Fendt, NP    Family History History reviewed. No pertinent family history.  Social History Social History   Tobacco Use  . Smoking status: Never Smoker  . Smokeless tobacco: Never Used  Vaping Use  . Vaping Use: Never used  Substance Use Topics  . Alcohol use: Not Currently  . Drug use: Never     Allergies   Other and Peanut-containing drug products   Review of Systems Review of Systems Per HPI  Physical Exam Triage Vital Signs ED Triage Vitals  Enc Vitals Group     BP 02/04/21 1731 127/81     Pulse Rate 02/04/21 1731 68     Resp 02/04/21 1731 18     Temp 02/04/21 1731 98.4 F (36.9 C)     Temp Source 02/04/21 1731  Oral     SpO2 02/04/21 1731 98 %     Weight --      Height --      Head Circumference --      Peak Flow --      Pain Score 02/04/21 1734 0     Pain Loc --      Pain Edu? --      Excl. in GC? --    No data found.  Updated Vital Signs BP 127/81 (BP Location: Left Arm)   Pulse 68   Temp 98.4 F (36.9 C) (Oral)   Resp 18   SpO2 98%   Visual Acuity Right Eye Distance:   Left Eye Distance:   Bilateral Distance:    Right Eye Near:   Left Eye Near:    Bilateral Near:     Physical Exam Vitals and nursing note reviewed.  Constitutional:      Appearance: Normal  appearance.  HENT:     Head: Atraumatic.     Right Ear: Tympanic membrane normal.     Left Ear: Tympanic membrane normal.     Nose: Rhinorrhea present.     Mouth/Throat:     Mouth: Mucous membranes are moist.     Pharynx: Posterior oropharyngeal erythema present. No oropharyngeal exudate.  Eyes:     Extraocular Movements: Extraocular movements intact.     Conjunctiva/sclera: Conjunctivae normal.  Cardiovascular:     Rate and Rhythm: Normal rate and regular rhythm.     Heart sounds: Normal heart sounds.  Pulmonary:     Effort: Pulmonary effort is normal. No respiratory distress.     Breath sounds: Normal breath sounds. No wheezing or rales.  Musculoskeletal:        General: Normal range of motion.     Cervical back: Normal range of motion and neck supple.  Skin:    General: Skin is warm and dry.  Neurological:     General: No focal deficit present.     Mental Status: He is oriented to person, place, and time.  Psychiatric:        Mood and Affect: Mood normal.        Thought Content: Thought content normal.        Judgment: Judgment normal.    UC Treatments / Results  Labs (all labs ordered are listed, but only abnormal results are displayed) Labs Reviewed  POCT INFLUENZA A/B    EKG   Radiology No results found.  Procedures Procedures (including critical care time)  Medications Ordered in UC Medications - No data to display  Initial Impression / Assessment and Plan / UC Course  I have reviewed the triage vital signs and the nursing notes.  Pertinent labs & imaging results that were available during my care of the patient were reviewed by me and considered in my medical decision making (see chart for details).     Exam and vitals overall reassuring, suspect viral illness.  Rapid flu negative but discussed possibility of a false negative, particularly given his home exposure.  Prednisone burst, Flonase, continued allergy and inhaler regimen recommended.   Follow-up for acutely worsening symptoms.  Final Clinical Impressions(s) / UC Diagnoses   Final diagnoses:  Viral URI with cough  Seasonal allergic rhinitis due to other allergic trigger  Mild intermittent asthma with acute exacerbation   Discharge Instructions   None    ED Prescriptions    Medication Sig Dispense Auth. Provider   predniSONE (DELTASONE) 20 MG tablet Take  2 tablets (40 mg total) by mouth daily with breakfast. 6 tablet Particia Nearing, PA-C   fluticasone [facility]) 50 MCG/ACT nasal spray Place 1 spray into both nostrils in the morning and at bedtime. 16 g Particia Nearing, [place]     PDMP not reviewed this encounter.   Particia Nearing, [place] 02/04/21 1816    Particia Nearing, PA-C 02/04/21 1817

## 2021-02-04 NOTE — ED Triage Notes (Signed)
Three day h/o cough, post nasal drip and congestion. Denies n/v/d. Has been taking tylenol with some relief. Son tested positive for flu today. Pt would like to be tested.

## 2021-02-13 ENCOUNTER — Emergency Department (HOSPITAL_COMMUNITY)
Admission: EM | Admit: 2021-02-13 | Discharge: 2021-02-13 | Disposition: A | Discharge status: Home / Self Care | Payer: BLUE CROSS/BLUE SHIELD | Attending: Emergency Medicine | Admitting: Emergency Medicine

## 2021-02-13 ENCOUNTER — Other Ambulatory Visit: Payer: Self-pay

## 2021-02-13 ENCOUNTER — Encounter (HOSPITAL_COMMUNITY): Payer: Self-pay

## 2021-02-13 DIAGNOSIS — R0981 Nasal congestion: Secondary | ICD-10-CM | POA: Diagnosis present

## 2021-02-13 DIAGNOSIS — J019 Acute sinusitis, unspecified: Secondary | ICD-10-CM | POA: Diagnosis not present

## 2021-02-13 DIAGNOSIS — Z91010 Allergy to peanuts: Secondary | ICD-10-CM | POA: Insufficient documentation

## 2021-02-13 MED ORDER — AMOXICILLIN-POT CLAVULANATE 875-125 MG PO TABS: 1.0000 | ORAL_TABLET | Freq: Two times a day (BID) | ORAL | 0 refills | Status: DC

## 2021-02-13 NOTE — ED Provider Notes (Signed)
[facility] EMERGENCY DEPARTMENT Provider Note   CSN: [id number] Arrival date & time: 02/13/21  1211     History No chief complaint on file.   Matthew Mcpherson is a 45 y.o. male.  45 year old male with prior medical history as detailed below presents for evaluation.  Patient complains of 3 weeks of ongoing sinus headache and congestion.  Patient denies fever.  Patient denies use of antibiotics.  Patient reports that he has significant sinus pain especially in the morning.  Patient reports significant postnasal drip.  Despite use of Flonase and OTC decongestants he has not had significant improvement in symptoms.  Patient does have already established follow-up visit with ENT next week.  The history is provided by the patient and medical records.  Illness Location:  Sinus headache, congestion Severity:  Moderate Onset quality:  Gradual Duration:  3 weeks Timing:  Constant Progression:  Worsening Chronicity:  New Associated symptoms: no fever        Past Medical History:  Diagnosis Date  . Allergy    Phreesia 12/20/2020    Patient Active Problem List   Diagnosis Date Noted  . Flatulence, eructation and gas pain 12/23/2020  . Right upper quadrant pain 12/23/2020  . Seasonal allergies 07/16/2020  . Gastroesophageal reflux disease without esophagitis 07/16/2020  . SOB (shortness of breath) 07/16/2020  . Elevated blood pressure reading in office without diagnosis of hypertension 07/16/2020  . Mild asthma without complication 07/16/2020  . Laryngopharyngeal reflux 11/06/2015  . Lumbar sprain 06/25/2008  . Unspecified viral infection, in conditions classified elsewhere and of unspecified site 04/08/2008  . Abnormal levels of other serum enzymes 12/24/2007  . Diaphragmatic hernia 12/24/2007  . Abnormal weight gain 12/19/2007  . Constipation 12/19/2007  . Dermatophytosis of groin and perianal area 12/19/2007  . Low back pain 12/19/2007    History  reviewed. No pertinent surgical history.     No family history on file.  Social History   Tobacco Use  . Smoking status: Never Smoker  . Smokeless tobacco: Never Used  Vaping Use  . Vaping Use: Never used  Substance Use Topics  . Alcohol use: Not Currently  . Drug use: Never    Home Medications Prior to Admission medications   Medication Sig Start Date End Date Taking? Authorizing Provider  albuterol (VENTOLIN HFA) 108 (90 Base) MCG/ACT inhaler INHALE 2 PUFFS INTO THE LUNGS EVERY 6 HOURS AS NEEDED FOR WHEEZING FOR SHORTNESS OF BREATH 11/27/20   Mayers, Cari S, PA-C  cetirizine (ZYRTEC) 10 MG tablet Take 1 tablet (10 mg total) by mouth daily. 11/16/20   Rema Fendt, NP  fluticasone (FLONASE) 50 MCG/ACT nasal spray Place 1 spray into both nostrils in the morning and at bedtime. 02/04/21   Particia Nearing, PA-C  montelukast (SINGULAIR) 10 MG tablet Take 1 tablet (10 mg total) by mouth at bedtime. 11/16/20   Rema Fendt, NP  omeprazole (PRILOSEC) 40 MG capsule Take 1 capsule (40 mg total) by mouth daily. 11/16/20   Rema Fendt, NP  predniSONE (DELTASONE) 20 MG tablet Take 2 tablets (40 mg total) by mouth daily with breakfast. 02/04/21   Particia Nearing, PA-C    Allergies    Other and Peanut-containing drug products  Review of Systems   Review of Systems  Constitutional: Negative for fever.  All other systems reviewed and are negative.   Physical Exam Updated Vital Signs BP (!) 142/85 (BP Location: Left Arm)   Pulse 75   Temp  99.2 F (37.3 C) (Oral)   Resp 16   SpO2 97%   Physical Exam Vitals and nursing note reviewed.  Constitutional:      General: He is not in acute distress.    Appearance: Normal appearance. He is well-developed.  HENT:     Head: Normocephalic and atraumatic.     Comments: Mild tenderness to palpation overlying frontal sinuses and maxillary sinuses    Right Ear: Tympanic membrane and ear canal normal.     Left Ear: Tympanic  membrane and ear canal normal.  Eyes:     Conjunctiva/sclera: Conjunctivae normal.     Pupils: Pupils are equal, round, and reactive to light.  Cardiovascular:     Rate and Rhythm: Normal rate and regular rhythm.     Heart sounds: Normal heart sounds.  Pulmonary:     Effort: Pulmonary effort is normal. No respiratory distress.     Breath sounds: Normal breath sounds.  Abdominal:     General: There is no distension.     Palpations: Abdomen is soft.     Tenderness: There is no abdominal tenderness.  Musculoskeletal:        General: No deformity. Normal range of motion.     Cervical back: Normal range of motion and neck supple.  Skin:    General: Skin is warm and dry.  Neurological:     General: No focal deficit present.     Mental Status: He is alert and oriented to person, place, and time.     ED Results / Procedures / Treatments   Labs (all labs ordered are listed, but only abnormal results are displayed) Labs Reviewed - No data to display  EKG None  Radiology No results found.  Procedures Procedures   Medications Ordered in ED Medications - No data to display  ED Course  I have reviewed the triage vital signs and the nursing notes.  Pertinent labs & imaging results that were available during my care of the patient were reviewed by me and considered in my medical decision making (see chart for details).    MDM Rules/Calculators/A&P                          MDM  MSE complete  Matthew Mcpherson was evaluated in Emergency Department on 02/13/2021 for the symptoms described in the history of present illness. He was evaluated in the context of the global COVID-19 pandemic, which necessitated consideration that the patient might be at risk for infection with the SARS-CoV-2 virus that causes COVID-19. Institutional protocols and algorithms that pertain to the evaluation of patients at risk for COVID-19 are in a state of rapid change based on information released by  regulatory bodies including the CDC and federal and state organizations. These policies and algorithms were followed during the patient's care in the ED.   Patient is presenting with 3 weeks of ongoing sinus pressure and pain with associated congestion.  Patient is denying fever.  Patient has not yet trialed antibiotics.  Patient does have already established follow-up visit with ENT next week.  Will prescribe course of antibiotics to cover bacterial sinusitis.  Patient does understand need for close follow-up with ENT.  Strict return precautions given and understood.  Final Clinical Impression(s) / ED Diagnoses Final diagnoses:  Acute sinusitis, recurrence not specified, unspecified location    Rx / DC Orders ED Discharge Orders         Ordered    amoxicillin-clavulanate (  AUGMENTIN) 875-125 MG tablet  Every 12 hours        02/13/21 1543           Wynetta Fines, MD 02/13/21 [phone]

## 2021-02-13 NOTE — Discharge Instructions (Signed)
Return for any problem.  ?

## 2021-02-13 NOTE — ED Triage Notes (Signed)
Patient complains of ongoing congestion, frontal headache, ear pain and sore throat for 3 weeks. Has been using otc meds with no further relief

## 2021-02-18 ENCOUNTER — Other Ambulatory Visit: Payer: Self-pay

## 2021-02-18 ENCOUNTER — Ambulatory Visit (INDEPENDENT_AMBULATORY_CARE_PROVIDER_SITE_OTHER): Payer: BLUE CROSS/BLUE SHIELD | Admitting: Emergency Medicine

## 2021-02-18 ENCOUNTER — Encounter: Payer: Self-pay | Admitting: Emergency Medicine

## 2021-02-18 VITALS — BP 126/82 | HR 74 | Temp 97.1°F | Ht 65.0 in | Wt 194.4 lb

## 2021-02-18 DIAGNOSIS — J3089 Other allergic rhinitis: Secondary | ICD-10-CM | POA: Diagnosis not present

## 2021-02-18 DIAGNOSIS — J45909 Unspecified asthma, uncomplicated: Secondary | ICD-10-CM

## 2021-02-18 DIAGNOSIS — K219 Gastro-esophageal reflux disease without esophagitis: Secondary | ICD-10-CM

## 2021-02-18 NOTE — Addendum Note (Signed)
Addended by: Maurene Capes on: 02/18/2021 09:37 AM   Modules accepted: Orders

## 2021-02-18 NOTE — Assessment & Plan Note (Signed)
Continue to treat with PPI

## 2021-02-18 NOTE — Assessment & Plan Note (Signed)
Unclear whether he has asthma in addition to significant allergic rhinitis.  He may have benefited from albuterol in the past.  He needs pulmonary function testing we will arrange for these.

## 2021-02-18 NOTE — Addendum Note (Signed)
Addended by: Demetrio Lapping E on: 02/18/2021 09:33 AM   Modules accepted: Orders

## 2021-02-18 NOTE — Progress Notes (Signed)
Subjective:    Patient ID: Matthew Mcpherson, male    DOB: 1975/11/05, 45 y.o.   MRN: [id number]  HPI 45 year old never smoker with little past medical history other than GERD, abdominal pain, diaphragmatic hernia.  He does deal with chronic allergic rhinitis.  He is referred today for evaluation of this as well as possible asthma. Has been managed on cetirizine, montelukast x 2 months, just added flonase 2 weeks ago.  He has chronic rhinitis, congestion Was in ED last week and treated for an acute sinusitis w abx Has been treated with pred before as well His breathing can intermittently be difficult, has dyspnea, throat noise. He feels a globus sensation at the time - currently better. He has albuterol, does believe that he benefits from it. Uses it rarely. Helped the dyspnea Triggers include pollen season, certain smells like scented candles, soaps.    Review of Systems As per HPI  Past Medical History:  Diagnosis Date   Allergy    Phreesia 12/20/2020     No family history on file.  Son has significant allergic disease   Social History   Socioeconomic History   Marital status: Married    Spouse name: Not on file   Number of children: Not on file   Years of education: Not on file   Highest education level: Not on file  Occupational History   Not on file  Tobacco Use   Smoking status: Never   Smokeless tobacco: Never  Vaping Use   Vaping Use: Never used  Substance and Sexual Activity   Alcohol use: Not Currently   Drug use: Never   Sexual activity: Not Currently  Other Topics Concern   Not on file  Social History Narrative   Not on file   Social Determinants of Health   Financial Resource Strain: Not on file  Food Insecurity: Not on file  Transportation Needs: Not on file  Physical Activity: Not on file  Stress: Not on file  Social Connections: Not on file  Intimate Partner Violence: Not on file    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 Patient shares he eats peanuts frequently and has no reaction.     Outpatient Medications Prior to Visit  Medication Sig Dispense Refill   albuterol (VENTOLIN HFA) 108 (90 Base) MCG/ACT inhaler INHALE 2 PUFFS INTO THE LUNGS EVERY 6 HOURS AS NEEDED FOR WHEEZING FOR SHORTNESS OF BREATH 9 g 0   cetirizine (ZYRTEC) 10 MG tablet Take 1 tablet (10 mg total) by mouth daily. 30 tablet 2   fluticasone (FLONASE) 50 MCG/ACT nasal spray Place 1 spray into both nostrils in the morning and at bedtime. 16 g 0   montelukast (SINGULAIR) 10 MG tablet Take 1 tablet (10 mg total) by mouth at bedtime. 30 tablet 3   omeprazole (PRILOSEC) 40 MG capsule Take 1 capsule (40 mg total) by mouth daily. 30 capsule 3   amoxicillin-clavulanate (AUGMENTIN) 875-125 MG tablet Take 1 tablet by mouth every 12 (twelve) hours. 14 tablet 0   predniSONE (DELTASONE) 20 MG tablet Take 2 tablets (40 mg total) by mouth daily with breakfast. 6 tablet 0   No facility-administered medications prior to visit.          Objective:   Physical Exam Vitals:   02/18/21 0857  BP: 126/82  Pulse: 74  Temp: (!) 97.1 F (36.2 C)  TempSrc: Temporal  SpO2: 98%  Weight: 194 lb 6.4 oz (88.2 kg)  Height: 5\' 5"  (1.651 m)   Gen: Pleasant, well-nourished, in no distress,  normal affect  ENT: No lesions,  mouth clear,  oropharynx clear, no postnasal drip  Neck: No JVD, no stridor  Lungs: No use of accessory muscles, no crackles or wheezing on normal respiration, no wheeze on forced expiration  Cardiovascular: RRR, heart sounds normal, no murmur or gallops, no peripheral edema  Musculoskeletal: No deformities, no cyanosis or clubbing  Neuro: alert, awake, non focal  Skin: Warm, no lesions or rash      Assessment & Plan:   Mild asthma without complication Unclear whether he has asthma in addition to  significant allergic rhinitis.  He may have benefited from albuterol in the past.  He needs pulmonary function testing we will arrange for these.  Laryngopharyngeal reflux Continue to treat with PPI  Seasonal allergies Significant impact of pollen during the allergy seasons.  He may have perennial symptoms.  He has benefited from the stepwise addition of Singulair, then nasal steroid.  Plan to continue his current regimen.  He needs allergy testing we will refer him for skin testing.  He may benefit from immunotherapy depending on the results    , MD, PhD 02/18/2021, 9:29 AM [facility] [phone] or if no answer before 7:00PM call [phone] For any issues after 7:00PM please call eLink [phone]

## 2021-02-18 NOTE — Assessment & Plan Note (Signed)
Significant impact of pollen during the allergy seasons.  He may have perennial symptoms.  He has benefited from the stepwise addition of Singulair, then nasal steroid.  Plan to continue his current regimen.  He needs allergy testing we will refer him for skin testing.  He may benefit from immunotherapy depending on the results

## 2021-02-19 LAB — RESPIRATORY ALLERGY PROFILE REGION II ~~LOC~~
Allergen, A. alternata, m6: <0.1 kU/L
Allergen, Cedar tree, t12: 0.69 kU/L — ABNORMAL HIGH
Allergen, Comm Silver Birch, t9: 13.4 kU/L — ABNORMAL HIGH
Allergen, Cottonwood, t14: <0.1 kU/L
Allergen, D pternoyssinus,d7: 4.99 kU/L — ABNORMAL HIGH
Allergen, Mouse Urine Protein, e78: <0.1 kU/L
Allergen, Mulberry, t76: <0.1 kU/L
Allergen, Oak,t7: 27.7 kU/L — ABNORMAL HIGH
Allergen, P. notatum, m1: <0.1 kU/L
Aspergillus fumigatus, m3: <0.1 kU/L
Bermuda Grass: 0.14 kU/L — ABNORMAL HIGH
Box Elder IgE: <0.1 kU/L
CLADOSPORIUM HERBARUM (M2) IGE: <0.1 kU/L
COMMON RAGWEED (SHORT) (W1) IGE: 0.22 kU/L — ABNORMAL HIGH
Cat Dander: 0.21 kU/L — ABNORMAL HIGH
Class: 0
Class: 0
Class: 0
Class: 0
Class: 0
Class: 0
Class: 0
Class: 0
Class: 0
Class: 0
Class: 0
Class: 0
Class: 0
Class: 1
Class: 2
Class: 3
Class: 3
Class: 3
Class: 4
Cockroach: 0.77 kU/L — ABNORMAL HIGH
D. farinae: 3.53 kU/L — ABNORMAL HIGH
Dog Dander: <0.1 kU/L
Elm IgE: 0.11 kU/L — ABNORMAL HIGH
IgE (Immunoglobulin E), Serum: 570 kU/L — ABNORMAL HIGH (ref <=114)
Johnson Grass: <0.1 kU/L
Pecan/Hickory Tree IgE: 0.34 kU/L — ABNORMAL HIGH
Rough Pigweed  IgE: <0.1 kU/L
Sheep Sorrel IgE: <0.1 kU/L
Timothy Grass: <0.1 kU/L

## 2021-02-19 LAB — INTERPRETATION:

## 2021-02-23 ENCOUNTER — Other Ambulatory Visit: Payer: Self-pay

## 2021-02-23 ENCOUNTER — Telehealth: Payer: Self-pay | Admitting: Family

## 2021-02-23 DIAGNOSIS — J302 Other seasonal allergic rhinitis: Secondary | ICD-10-CM

## 2021-02-23 MED ORDER — CETIRIZINE HCL 10 MG PO TABS: 10.0000 mg | ORAL_TABLET | Freq: Every day | ORAL | 2 refills | Status: DC

## 2021-02-23 NOTE — Telephone Encounter (Signed)
1) Medication(s) Requested (by name):cetirizine (ZYRTEC) 10 MG tablet   2) Pharmacy of Choice:[id number] - [place] ([place]), [place] - [address]  [address] [place]) [place] [address]  Phone:  [phone]  Fax:  [phone]   3) Special Requests:  Approved medications will be sent to the pharmacy, we will reach out if there is an issue.  Requests made after 3pm may not be addressed until the following business day!  If a patient is unsure of the name of the medication(s) please note and ask patient to call back when they are able to provide all info, do not send to responsible party until all information is available!

## 2021-03-09 ENCOUNTER — Other Ambulatory Visit (HOSPITAL_COMMUNITY)
Admission: RE | Admit: 2021-03-09 | Discharge: 2021-03-09 | Disposition: A | Discharge status: Home / Self Care | Payer: BLUE CROSS/BLUE SHIELD | Source: Ambulatory Visit | Attending: Emergency Medicine | Admitting: Emergency Medicine

## 2021-03-09 DIAGNOSIS — Z20822 Contact with and (suspected) exposure to covid-19: Secondary | ICD-10-CM | POA: Insufficient documentation

## 2021-03-09 DIAGNOSIS — Z01812 Encounter for preprocedural laboratory examination: Secondary | ICD-10-CM | POA: Insufficient documentation

## 2021-03-09 LAB — SARS CORONAVIRUS 2 (TAT 6-24 HRS): SARS Coronavirus 2: NEGATIVE

## 2021-03-12 ENCOUNTER — Encounter: Payer: Self-pay | Admitting: Emergency Medicine

## 2021-03-12 ENCOUNTER — Ambulatory Visit: Payer: BLUE CROSS/BLUE SHIELD | Admitting: Emergency Medicine

## 2021-03-12 ENCOUNTER — Ambulatory Visit (INDEPENDENT_AMBULATORY_CARE_PROVIDER_SITE_OTHER): Payer: BLUE CROSS/BLUE SHIELD | Admitting: Emergency Medicine

## 2021-03-12 ENCOUNTER — Other Ambulatory Visit: Payer: Self-pay

## 2021-03-12 DIAGNOSIS — J45909 Unspecified asthma, uncomplicated: Secondary | ICD-10-CM

## 2021-03-12 DIAGNOSIS — J302 Other seasonal allergic rhinitis: Secondary | ICD-10-CM

## 2021-03-12 DIAGNOSIS — K219 Gastro-esophageal reflux disease without esophagitis: Secondary | ICD-10-CM

## 2021-03-12 LAB — PULMONARY FUNCTION TEST
DL/VA % pred: 145 %
DL/VA: 6.84 ml/min/mmHg/L
DLCO cor % pred: 107 %
DLCO cor: 26.87 ml/min/mmHg
DLCO unc % pred: 107 %
DLCO unc: 26.87 ml/min/mmHg
FEF 25-75 Post: 3.81 L/sec
FEF 25-75 Pre: 3.87 L/sec
FEF2575-%Change-Post: -1 %
FEF2575-%Pred-Post: 117 %
FEF2575-%Pred-Pre: 119 %
FEV1-%Change-Post: -2 %
FEV1-%Pred-Post: 78 %
FEV1-%Pred-Pre: 80 %
FEV1-Post: 2.64 L
FEV1-Pre: 2.72 L
FEV1FVC-%Change-Post: 1 %
FEV1FVC-%Pred-Pre: 116 %
FEV6-%Change-Post: -4 %
FEV6-%Pred-Post: 68 %
FEV6-%Pred-Pre: 71 %
FEV6-Post: 2.82 L
FEV6-Pre: 2.95 L
FEV6FVC-%Change-Post: 0 %
FEV6FVC-%Pred-Post: 103 %
FEV6FVC-%Pred-Pre: 102 %
FVC-%Change-Post: -4 %
FVC-%Pred-Post: 66 %
FVC-%Pred-Pre: 69 %
FVC-Post: 2.82 L
FVC-Pre: 2.96 L
Post FEV1/FVC ratio: 94 %
Post FEV6/FVC ratio: 100 %
Pre FEV1/FVC ratio: 92 %
Pre FEV6/FVC Ratio: 100 %
RV % pred: 34 %
RV: 0.55 L
TLC % pred: 62 %
TLC: 3.59 L

## 2021-03-12 MED ORDER — OMEPRAZOLE 40 MG PO CPDR: 40.0000 mg | DELAYED_RELEASE_CAPSULE | Freq: Two times a day (BID) | ORAL | 2 refills | Status: DC

## 2021-03-12 MED ORDER — PANTOPRAZOLE SODIUM 40 MG PO TBEC: 40.0000 mg | DELAYED_RELEASE_TABLET | Freq: Two times a day (BID) | ORAL | 2 refills | Status: DC

## 2021-03-12 NOTE — Assessment & Plan Note (Signed)
Question breakthrough reflux with continued upper airway lability.  Plan empiric trial of increasing omeprazole to 40 mg twice daily for at least 2 weeks.  He will let us know if this changes symptoms.  If he does have significant persisting GERD then he may need a gastroenterology evaluation.  We can discuss next time.

## 2021-03-12 NOTE — Assessment & Plan Note (Signed)
Significant allergies, documented on his allergic testing with particular sensitivities to trees.  His IgE was 570.  He may need allergy referral, testing and possibly immunotherapy.  We will consider this depending on how well symptoms remain controlled on his current Singulair, Zyrtec, fluticasone nasal spray.

## 2021-03-12 NOTE — Progress Notes (Signed)
PFT done today. 

## 2021-03-12 NOTE — Assessment & Plan Note (Signed)
PFT today principally restricted without any clear evidence for asthma.  I did explain to him that he may still have acute bronchospasm or obstruction given specific triggers and that he needs to keep his albuterol available to use 2 puffs for shortness of breath, etc.  Could consider methacholine challenge at some point going forward if we need to truly establish a diagnosis.  I suspect that he does have mild intermittent asthma based on symptoms and the connection to his allergic disease.

## 2021-03-12 NOTE — Progress Notes (Signed)
Subjective:    Patient ID: Matthew Mcpherson, male    DOB: Jun 19, 1976, 45 y.o.   MRN: [id number]  HPI 45 year old never smoker with little past medical history other than GERD, abdominal pain, diaphragmatic hernia.  He does deal with chronic allergic rhinitis.  He is referred today for evaluation of this as well as possible asthma. Has been managed on cetirizine, montelukast x 2 months, just added flonase 2 weeks ago.  He has chronic rhinitis, congestion Was in ED last week and treated for an acute sinusitis w abx Has been treated with pred before as well His breathing can intermittently be difficult, has dyspnea, throat noise. He feels a globus sensation at the time - currently better. He has albuterol, does believe that he benefits from it. Uses it rarely. Helped the dyspnea Triggers include pollen season, certain smells like scented candles, soaps.   ROV 03/12/21 --follow-up visit 45 year old gentleman with a history of GERD, diaphragmatic hernia, abdominal pain and severe chronic allergic rhinitis.  Some question of whether he may also have asthma based on symptoms of dyspnea, possible response in the past albuterol.  His symptoms have sounded largely like they are upper airway in nature.  We performed pulmonary function testing as below.  Today he reports that he has been feeling well. Has not needed frequent albuterol, but did require recently after some dust exposure. He questions whether he may have some increased GERD happening. Remains on fluticasone, zyrtec, singulair  Pulmonary function testing reviewed by me performed today.  These show evidence for restriction on spirometry without a bronchodilator response, restricted lung volumes, normal diffusion capacity.  No curve to the flow volume loop.   Review of Systems As per HPI  Past Medical History:  Diagnosis Date   Allergy    Phreesia 12/20/2020     No family history on file.  Son has significant allergic disease   Social  History   Socioeconomic History   Marital status: Married    Spouse name: Not on file   Number of children: Not on file   Years of education: Not on file   Highest education level: Not on file  Occupational History   Not on file  Tobacco Use   Smoking status: Never   Smokeless tobacco: Never  Vaping Use   Vaping Use: Never used  Substance and Sexual Activity   Alcohol use: Not Currently   Drug use: Never   Sexual activity: Not Currently  Other Topics Concern   Not on file  Social History Narrative   Not on file   Social Determinants of Health   Financial Resource Strain: Not on file  Food Insecurity: Not on file  Transportation Needs: Not on file  Physical Activity: Not on file  Stress: Not on file  Social Connections: Not on file  Intimate Partner Violence: Not on file    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 Patient shares he eats peanuts frequently and has no reaction.     Outpatient Medications Prior to Visit  Medication Sig Dispense Refill   albuterol (VENTOLIN HFA) 108 (90 Base) MCG/ACT inhaler INHALE 2 PUFFS INTO THE LUNGS EVERY 6 HOURS AS NEEDED FOR WHEEZING FOR SHORTNESS OF BREATH 9 g 0   cetirizine (ZYRTEC) 10 MG tablet Take 1 tablet (10 mg total) by mouth daily. 30 tablet 2  fluticasone (FLONASE) 50 MCG/ACT nasal spray Place 1 spray into both nostrils in the morning and at bedtime. 16 g 0   montelukast (SINGULAIR) 10 MG tablet Take 1 tablet (10 mg total) by mouth at bedtime. 30 tablet 3   omeprazole (PRILOSEC) 40 MG capsule Take 1 capsule (40 mg total) by mouth daily. 30 capsule 3   No facility-administered medications prior to visit.          Objective:   Physical Exam Vitals:   03/12/21 1649  BP: 118/80  Pulse: (!) 55  SpO2: 99%  Weight: 196 lb 6.4 oz (89.1 kg)  Height: 5\' 4"  (1.626 m)   Gen:  Pleasant, overwt man, in no distress,  normal affect  ENT: No lesions,  mouth clear,  oropharynx clear, no postnasal drip  Neck: No JVD, no stridor  Lungs: No use of accessory muscles, no crackles or wheezing on normal respiration, no wheeze on forced expiration  Cardiovascular: RRR, heart sounds normal, no murmur or gallops, no peripheral edema  Musculoskeletal: No deformities, no cyanosis or clubbing  Neuro: alert, awake, non focal  Skin: Warm, no lesions or rash      Assessment & Plan:   Laryngopharyngeal reflux Question breakthrough reflux with continued upper airway lability.  Plan empiric trial of increasing omeprazole to 40 mg twice daily for at least 2 weeks.  He will let know if this changes symptoms.  If he does have significant persisting GERD then he may need a gastroenterology evaluation.  We can discuss next time.  Mild asthma without complication PFT today principally restricted without any clear evidence for asthma.  I did explain to him that he may still have acute bronchospasm or obstruction given specific triggers and that he needs to keep his albuterol available to use 2 puffs for shortness of breath, etc.  Could consider methacholine challenge at some point going forward if we need to truly establish a diagnosis.  I suspect that he does have mild intermittent asthma based on symptoms and the connection to his allergic disease.  Seasonal allergies Significant allergies, documented on his allergic testing with particular sensitivities to trees.  His IgE was 570.  He may need allergy referral, testing and possibly immunotherapy.  We will consider this depending on how well symptoms remain controlled on his current Singulair, Zyrtec, fluticasone nasal spray.  Time spent reviewing testing, face to face with patient, 40 minutes  [place], MD, PhD 03/12/2021, 5:18 PM [facility] [phone] or if no answer before 7:00PM call  [phone] For any issues after 7:00PM please call eLink [phone]

## 2021-03-12 NOTE — Patient Instructions (Signed)
Keep your albuterol available to use 2 puffs if needed for shortness of breath, chest tightness, wheezing Please continue Singulair, Zyrtec as you have been taking them Please continue your fluticasone nasal spray, 2 sprays each nostril once daily. Temporarily increase your omeprazole to 40 mg twice a day for about 2 weeks.  See if this helps with your airway irritation and breathing.  If so we may decide to continue at the higher dose.  Also we may decide to have you evaluated by gastroenterology. Follow with Dr Delton Coombes in 1 month

## 2021-04-07 ENCOUNTER — Encounter: Payer: Self-pay | Admitting: Family

## 2021-04-07 ENCOUNTER — Other Ambulatory Visit: Payer: Self-pay | Admitting: Family

## 2021-04-07 ENCOUNTER — Other Ambulatory Visit: Payer: Self-pay

## 2021-04-07 DIAGNOSIS — J302 Other seasonal allergic rhinitis: Secondary | ICD-10-CM

## 2021-04-07 MED ORDER — MONTELUKAST SODIUM 10 MG PO TABS: 10.0000 mg | ORAL_TABLET | Freq: Every day | ORAL | 0 refills | Status: DC

## 2021-04-22 ENCOUNTER — Encounter: Payer: Self-pay | Admitting: Emergency Medicine

## 2021-04-22 ENCOUNTER — Other Ambulatory Visit: Payer: Self-pay

## 2021-04-22 ENCOUNTER — Ambulatory Visit: Payer: BLUE CROSS/BLUE SHIELD | Admitting: Emergency Medicine

## 2021-04-22 DIAGNOSIS — J45909 Unspecified asthma, uncomplicated: Secondary | ICD-10-CM

## 2021-04-22 DIAGNOSIS — K219 Gastro-esophageal reflux disease without esophagitis: Secondary | ICD-10-CM | POA: Diagnosis not present

## 2021-04-22 DIAGNOSIS — J302 Other seasonal allergic rhinitis: Secondary | ICD-10-CM

## 2021-04-22 NOTE — Patient Instructions (Addendum)
Please continue Singulair 10 mg each evening Please continue Zyrtec 10 mg once daily. Continue to use your fluticasone nasal spray, 2 sprays each nostril as you needed for increased nasal congestion You could consider trying nasal saline rinses to help with mucus clearance and allergy symptoms. If your throat irritation and allergy symptoms persist or worsen then please call us so that we consider referral to Allergy for possible immunotherapy (allergy shots) Continue your omeprazole 40 mg once daily. Keep your albuterol available to use 2 puffs when needed for shortness of breath, chest tightness, wheezing. Follow with Dr. Delton Coombes in 12 months or sooner if you have any problems.

## 2021-04-22 NOTE — Assessment & Plan Note (Signed)
Very well may have some rare intermittent lower airways obstruction but his main symptom is upper airway in nature.  He understands the difference, is not using albuterol with any frequency.  I do want him to have it available in case he experiences symptoms more consistent with bronchospasm.

## 2021-04-22 NOTE — Assessment & Plan Note (Signed)
Significant allergic disease, still with some mucus that he has to clear especially in the morning.  This leads to upper airway irritation which is a suppressible symptom, sometimes causes intermittent upper airway obstruction that improves when he relaxes.  He is on Singulair, Zyrtec, decreased his Flonase because it was actually causing some upper airway irritation.  He still uses this as needed.  Discussed possible referral to allergist for immunotherapy.  Is going to think about this depending on the symptom burden

## 2021-04-22 NOTE — Assessment & Plan Note (Signed)
Fairly well controlled on omeprazole 40 mg daily.  No real difference in his symptoms when we doubled this temporarily.  Plan continue current dosing

## 2021-04-22 NOTE — Progress Notes (Signed)
Subjective:    Patient ID: Matthew Mcpherson, male    DOB: Apr 11, 1976, 45 y.o.   MRN: [id number]  HPI 45 year old never smoker with little past medical history other than GERD, abdominal pain, diaphragmatic hernia.  He does deal with chronic allergic rhinitis.  He is referred today for evaluation of this as well as possible asthma. Has been managed on cetirizine, montelukast x 2 months, just added flonase 2 weeks ago.  He has chronic rhinitis, congestion Was in ED last week and treated for an acute sinusitis w abx Has been treated with pred before as well His breathing can intermittently be difficult, has dyspnea, throat noise. He feels a globus sensation at the time - currently better. He has albuterol, does believe that he benefits from it. Uses it rarely. Helped the dyspnea Triggers include pollen season, certain smells like scented candles, soaps.   ROV 03/12/21 --follow-up visit 45 year old gentleman with a history of GERD, diaphragmatic hernia, abdominal pain and severe chronic allergic rhinitis.  Some question of whether he may also have asthma based on symptoms of dyspnea, possible response in the past albuterol.  His symptoms have sounded largely like they are upper airway in nature.  We performed pulmonary function testing as below.  Today he reports that he has been feeling well. Has not needed frequent albuterol, but did require recently after some dust exposure. He questions whether he may have some increased GERD happening. Remains on fluticasone, zyrtec, singulair  Pulmonary function testing reviewed by me performed today.  These show evidence for restriction on spirometry without a bronchodilator response, restricted lung volumes, normal diffusion capacity.  No curve to the flow volume loop.  ROV 04/22/21 --45 year old man, never smoker with GERD, diaphragmatic hernia, abdominal pain, chronic allergic rhinitis.  Suspected mild asthma based on clinical findings although spirometry without  any clear evidence for obstruction.  As last visit we increased his omeprazole to 40 mg twice daily to see if he get benefit.  Also continued Singulair, Zyrtec. The flonase irritated his throat so only using prn.  Today he reports mucous in his throat in the am. Much less cough. Has not used the albuterol any since last time. Back down to omeprazole qd.    Review of Systems As per HPI  Past Medical History:  Diagnosis Date   Allergy    Phreesia 12/20/2020     No family history on file.  Son has significant allergic disease   Social History   Socioeconomic History   Marital status: Married    Spouse name: Not on file   Number of children: Not on file   Years of education: Not on file   Highest education level: Not on file  Occupational History   Not on file  Tobacco Use   Smoking status: Never   Smokeless tobacco: Never  Vaping Use   Vaping Use: Never used  Substance and Sexual Activity   Alcohol use: Not Currently   Drug use: Never   Sexual activity: Not Currently  Other Topics Concern   Not on file  Social History Narrative   Not on file   Social Determinants of Health   Financial Resource Strain: Not on file  Food Insecurity: Not on file  Transportation Needs: Not on file  Physical Activity: Not on file  Stress: Not on file  Social Connections: Not on file  Intimate Partner Violence: Not on file    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 Patient shares he eats peanuts frequently and has no reaction.     Outpatient Medications Prior to Visit  Medication Sig Dispense Refill   albuterol (VENTOLIN HFA) 108 (90 Base) MCG/ACT inhaler INHALE 2 PUFFS INTO THE LUNGS EVERY 6 HOURS AS NEEDED FOR WHEEZING FOR SHORTNESS OF BREATH 9 g 0   cetirizine (ZYRTEC) 10 MG tablet Take 1 tablet (10 mg total) by mouth daily. 30 tablet 2    fluticasone (FLONASE) 50 MCG/ACT nasal spray Place 1 spray into both nostrils in the morning and at bedtime. 16 g 0   montelukast (SINGULAIR) 10 MG tablet Take 1 tablet (10 mg total) by mouth at bedtime. 90 tablet 0   omeprazole (PRILOSEC) 40 MG capsule Take 1 capsule (40 mg total) by mouth in the morning and at bedtime. 60 capsule 2   No facility-administered medications prior to visit.          Objective:   Physical Exam Vitals:   04/22/21 0853  BP: 128/70  Pulse: 63  Temp: 98.6 F (37 C)  TempSrc: Oral  SpO2: 100%  Weight: 198 lb 9.6 oz (90.1 kg)  Height: 5\' 5"  (1.651 m)   Gen: Pleasant, overwt man, in no distress,  normal affect  ENT: No lesions,  mouth clear,  oropharynx clear, no postnasal drip  Neck: No JVD, no stridor  Lungs: No use of accessory muscles, no crackles or wheezing on normal respiration, no wheeze on forced expiration  Cardiovascular: RRR, heart sounds normal, no murmur or gallops, no peripheral edema  Musculoskeletal: No deformities, no cyanosis or clubbing  Neuro: alert, awake, non focal  Skin: Warm, no lesions or rash      Assessment & Plan:   Seasonal allergies Significant allergic disease, still with some mucus that he has to clear especially in the morning.  This leads to upper airway irritation which is a suppressible symptom, sometimes causes intermittent upper airway obstruction that improves when he relaxes.  He is on Singulair, Zyrtec, decreased his Flonase because it was actually causing some upper airway irritation.  He still uses this as needed.  Discussed possible referral to allergist for immunotherapy.  Is going to think about this depending on the symptom burden  Laryngopharyngeal reflux Fairly well controlled on omeprazole 40 mg daily.  No real difference in his symptoms when we doubled this temporarily.  Plan continue current dosing  Mild asthma without complication Very well may have some rare intermittent lower airways  obstruction but his main symptom is upper airway in nature.  He understands the difference, is not using albuterol with any frequency.  I do want him to have it available in case he experiences symptoms more consistent with bronchospasm.    , MD, PhD 04/22/2021, 9:21 AM [facility] [phone] or if no answer before 7:00PM call [phone] For any issues after 7:00PM please call eLink [phone]

## 2021-06-14 ENCOUNTER — Other Ambulatory Visit: Payer: Self-pay | Admitting: Physician Assistant

## 2021-06-14 DIAGNOSIS — J45909 Unspecified asthma, uncomplicated: Secondary | ICD-10-CM

## 2021-06-17 ENCOUNTER — Other Ambulatory Visit: Payer: Self-pay | Admitting: Physician Assistant

## 2021-06-17 DIAGNOSIS — J45909 Unspecified asthma, uncomplicated: Secondary | ICD-10-CM

## 2021-06-18 MED ORDER — ALBUTEROL SULFATE HFA 108 (90 BASE) MCG/ACT IN AERS: 1.0000 | INHALATION_SPRAY | Freq: Four times a day (QID) | RESPIRATORY_TRACT | 0 refills | Status: DC | PRN

## 2021-06-18 NOTE — Telephone Encounter (Signed)
Albuterol (Ventolin HFA) inhaler refilled per patient request. Last appointment 12/23/2020 regarding the requested medication. Patient was also referred to Pulmonology at that time. Please schedule appointment for additional refills.

## 2021-06-18 NOTE — Telephone Encounter (Signed)
Routed to PCP to refill if appropriate.  ?

## 2021-06-25 ENCOUNTER — Telehealth: Payer: Self-pay | Admitting: Family

## 2021-06-25 NOTE — Telephone Encounter (Signed)
Patient came to the office stating he needs a refill on his medication  cetirizine (ZYRTEC) 10 MG tablet    Pharmacy [facility] on [address]

## 2021-06-29 ENCOUNTER — Other Ambulatory Visit: Payer: Self-pay | Admitting: *Deleted

## 2021-06-29 DIAGNOSIS — J302 Other seasonal allergic rhinitis: Secondary | ICD-10-CM

## 2021-06-29 MED ORDER — CETIRIZINE HCL 10 MG PO TABS: 10.0000 mg | ORAL_TABLET | Freq: Every day | ORAL | 2 refills | Status: AC

## 2021-07-01 ENCOUNTER — Other Ambulatory Visit: Payer: Self-pay

## 2021-07-01 ENCOUNTER — Emergency Department (HOSPITAL_COMMUNITY)
Admission: EM | Admit: 2021-07-01 | Discharge: 2021-07-01 | Disposition: A | Discharge status: Home / Self Care | Payer: BLUE CROSS/BLUE SHIELD | Attending: Emergency Medicine | Admitting: Emergency Medicine

## 2021-07-01 ENCOUNTER — Encounter (HOSPITAL_COMMUNITY): Payer: Self-pay | Admitting: Emergency Medicine

## 2021-07-01 DIAGNOSIS — J029 Acute pharyngitis, unspecified: Secondary | ICD-10-CM | POA: Diagnosis present

## 2021-07-01 DIAGNOSIS — J45909 Unspecified asthma, uncomplicated: Secondary | ICD-10-CM | POA: Insufficient documentation

## 2021-07-01 DIAGNOSIS — M791 Myalgia, unspecified site: Secondary | ICD-10-CM | POA: Diagnosis not present

## 2021-07-01 DIAGNOSIS — R5383 Other fatigue: Secondary | ICD-10-CM | POA: Diagnosis not present

## 2021-07-01 DIAGNOSIS — Z91010 Allergy to peanuts: Secondary | ICD-10-CM | POA: Diagnosis not present

## 2021-07-01 LAB — BASIC METABOLIC PANEL
Anion gap: 11 (ref 5–15)
BUN: 13 mg/dL (ref 6–20)
CO2: 22 mmol/L (ref 22–32)
Calcium: 9.1 mg/dL (ref 8.9–10.3)
Chloride: 104 mmol/L (ref 98–111)
Creatinine, Ser: 0.85 mg/dL (ref 0.61–1.24)
GFR, Estimated: >60 mL/min (ref >60)
Glucose, Bld: 93 mg/dL (ref 70–99)
Potassium: 3.5 mmol/L (ref 3.5–5.1)
Sodium: 137 mmol/L (ref 135–145)

## 2021-07-01 LAB — CBC WITH DIFFERENTIAL/PLATELET
Abs Immature Granulocytes: 0.01 10*3/uL (ref 0.00–0.07)
Basophils Absolute: 0 10*3/uL (ref 0.0–0.1)
Basophils Relative: 0 %
Eosinophils Absolute: 0.1 10*3/uL (ref 0.0–0.5)
Eosinophils Relative: 1 %
HCT: 45.2 % (ref 39.0–52.0)
Hemoglobin: 15.2 g/dL (ref 13.0–17.0)
Immature Granulocytes: 0 %
Lymphocytes Relative: 14 %
Lymphs Abs: 0.7 10*3/uL (ref 0.7–4.0)
MCH: 29.2 pg (ref 26.0–34.0)
MCHC: 33.6 g/dL (ref 30.0–36.0)
MCV: 86.8 fL (ref 80.0–100.0)
Monocytes Absolute: 0.4 10*3/uL (ref 0.1–1.0)
Monocytes Relative: 9 %
Neutro Abs: 3.6 10*3/uL (ref 1.7–7.7)
Neutrophils Relative %: 76 %
Platelets: 163 10*3/uL (ref 150–400)
RBC: 5.21 MIL/uL (ref 4.22–5.81)
RDW: 12.5 % (ref 11.5–15.5)
WBC: 4.8 10*3/uL (ref 4.0–10.5)
nRBC: 0 % (ref 0.0–0.2)

## 2021-07-01 LAB — GROUP A STREP BY PCR: Group A Strep by PCR: NOT DETECTED

## 2021-07-01 NOTE — Discharge Instructions (Addendum)
You have been seen and discharged from the emergency department.  Use over-the-counter sprays.  Chest.  Follow-up with your primary provider for reevaluation and further care. Take home medications as prescribed. If you have any worsening symptoms or further concerns for your health please return to an emergency department for further evaluation.

## 2021-07-01 NOTE — ED Provider Notes (Signed)
[facility] EMERGENCY DEPARTMENT Provider Note   CSN: [id number] Arrival date & time: 07/01/21  1319    History Chief Complaint  Patient presents with   Sore Throat    Matthew Mcpherson is a 45 y.o. male.  HPI  45 year old male with recent diagnosis of COVID presents emergency department concern for sore throat.  Patient states that his symptoms started 6 days ago.  Has been suffering from fatigue, myalgias, sore throat.  This is what prompted him to present to an urgent care visit yesterday.  He states that he tested COVID-positive, was recommended symptomatic treatment and sent home.  Patient states this morning he woke up with worsening sore throat.  He states prior to coming he was having pain when he swallowed and felt like he was choking on certain foods.  Patient has history of esophagitis, follows up with gastroenterology as an outpatient, he has an EGD scheduled in a couple weeks.  Patient reportedly in triage was nonverbal and refusing to talk.  On my exam patient is conversational, breathing with no difficulty, controlling secretions, comfortable.  Patient denies any chest pain, shortness of breath.  Past Medical History:  Diagnosis Date   Allergy    Phreesia 12/20/2020    Patient Active Problem List   Diagnosis Date Noted   Flatulence, eructation and gas pain 12/23/2020   Right upper quadrant pain 12/23/2020   Seasonal allergies 07/16/2020   Gastroesophageal reflux disease without esophagitis 07/16/2020   SOB (shortness of breath) 07/16/2020   Elevated blood pressure reading in office without diagnosis of hypertension 07/16/2020   Mild asthma without complication 07/16/2020   Laryngopharyngeal reflux 11/06/2015   Lumbar sprain 06/25/2008   Unspecified viral infection, in conditions classified elsewhere and of unspecified site 04/08/2008   Abnormal levels of other serum enzymes 12/24/2007   Diaphragmatic hernia 12/24/2007   Abnormal weight gain  12/19/2007   Constipation 12/19/2007   Dermatophytosis of groin and perianal area 12/19/2007   Low back pain 12/19/2007    History reviewed. No pertinent surgical history.     No family history on file.  Social History   Tobacco Use   Smoking status: Never   Smokeless tobacco: Never  Vaping Use   Vaping Use: Never used  Substance Use Topics   Alcohol use: Not Currently   Drug use: Never    Home Medications Prior to Admission medications   Medication Sig Start Date End Date Taking? Authorizing Provider  albuterol (VENTOLIN HFA) 108 (90 Base) MCG/ACT inhaler Inhale 1-2 puffs into the lungs every 6 (six) hours as needed for wheezing or shortness of breath. 06/18/21 08/17/21  Rema Fendt, NP  cetirizine (ZYRTEC) 10 MG tablet Take 1 tablet (10 mg total) by mouth daily. 06/29/21   Rema Fendt, NP  fluticasone (FLONASE) 50 MCG/ACT nasal spray Place 1 spray into both nostrils in the morning and at bedtime. 02/04/21   Particia Nearing, PA-C  montelukast (SINGULAIR) 10 MG tablet Take 1 tablet (10 mg total) by mouth at bedtime. 04/07/21   Rema Fendt, NP  omeprazole (PRILOSEC) 40 MG capsule Take 1 capsule (40 mg total) by mouth in the morning and at bedtime. 03/12/21   Leslye Peer, MD    Allergies    Other and Peanut-containing drug products  Review of Systems   Review of Systems  Constitutional:  Positive for appetite change and fatigue. Negative for chills and fever.  HENT:  Positive for congestion, sore throat and trouble swallowing.  Negative for drooling, facial swelling and mouth sores.   Respiratory:  Negative for shortness of breath.   Cardiovascular:  Negative for chest pain.  Gastrointestinal:  Negative for abdominal pain, diarrhea and vomiting.  Genitourinary:  Negative for dysuria.  Musculoskeletal:  Positive for myalgias.  Skin:  Negative for rash.  Neurological:  Negative for headaches.   Physical Exam Updated Vital Signs BP (!) 134/94 (BP  Location: Left Arm)   Pulse 71   Temp 98.1 F (36.7 C) (Oral)   Resp 18   Ht 5\' 5"  (1.651 m)   Wt 86.2 kg   SpO2 95%   BMI 31.62 kg/m   Physical Exam Vitals and nursing note reviewed.  Constitutional:      General: He is not in acute distress.    Appearance: Normal appearance. He is not ill-appearing.  HENT:     Head: Normocephalic.     Mouth/Throat:     Mouth: Mucous membranes are moist.     Pharynx: Uvula midline. Posterior oropharyngeal erythema present. No pharyngeal swelling, oropharyngeal exudate or uvula swelling.     Comments: Initially the end of the uvula appeared slightly deviated to the patient's left but became midline with phonation, no other abnormal swelling or sign of abscess Cardiovascular:     Rate and Rhythm: Normal rate.  Pulmonary:     Effort: Pulmonary effort is normal. No respiratory distress.  Abdominal:     Palpations: Abdomen is soft.     Tenderness: There is no abdominal tenderness.  Skin:    General: Skin is warm.  Neurological:     Mental Status: He is alert and oriented to person, place, and time. Mental status is at baseline.  Psychiatric:        Mood and Affect: Mood normal.    ED Results / Procedures / Treatments   Labs (all labs ordered are listed, but only abnormal results are displayed) Labs Reviewed  GROUP A STREP BY PCR  BASIC METABOLIC PANEL  CBC WITH DIFFERENTIAL/PLATELET    EKG None  Radiology No results found.  Procedures Procedures   Medications Ordered in ED Medications - No data to display  ED Course  I have reviewed the triage vital signs and the nursing notes.  Pertinent labs & imaging results that were available during my care of the patient were reviewed by me and considered in my medical decision making (see chart for details).    MDM Rules/Calculators/A&P                           45 year old male presents emergency department ongoing sore throat, tested COVID-positive yesterday.  Has had symptoms  for about a week.  States prior to arrival he was having painful swallowing and difficulty speaking.  Denies any chest pain or shortness of breath.  Initially in triage she was refusing to speak.  On my exam he is conversational, comfortable, swallowing without difficulty, sipping on water.  He has no stridor, he has no significant oral swelling. No indication for CT imaging. Symptoms most likely 2/2 COVID. There is erythema without exudates in the pharynx, will test for strep.  Strep swab is negative.  Patient continues to speak without difficulty.  Controlling secretions, drinking freely.  Have advised the patient on further symptomatic control of his COVID symptoms and sore throat.  No indication for further emergent imaging/treatment.  Patient at this time appears safe and stable for discharge and will be treated as  an outpatient.  Discharge plan and strict return to ED precautions discussed, patient verbalizes understanding and agreement.  Final Clinical Impression(s) / ED Diagnoses Final diagnoses:  None    Rx / DC Orders ED Discharge Orders     None        Rozelle Logan, DO 07/01/21 2002

## 2021-07-01 NOTE — ED Triage Notes (Signed)
Pt unable to talk- airway intact. Pt showed recent hospital papers that he recently was given magic mouthwash for sore throat. Pt has covid (unsure when diagnosed).

## 2021-07-01 NOTE — ED Provider Notes (Signed)
Emergency Medicine Provider Triage Evaluation Note  Matthew Mcpherson , a 45 y.o. male  was evaluated in triage.  Pt complains of sore throat.  Patient is either unable or unwilling to verbalize thoughts, he is pointing to his discharge instructions from urgent care visit 06/30/2021.  He is COVID-positive, reports he is unable to swallow (by pointing at that under the return precautions).  Nods yes that it hurts when he swallows, nods yes that he is choking when he is trying to swallow food at home.  Patient has an EGD scheduled November 14.  Unable to obtain additional history.  Review of Systems  Positive: Sore throat Negative: Fever  Physical Exam  BP (!) 152/97 (BP Location: Right Arm)   Pulse 93   Temp 99 F (37.2 C) (Oral)   Resp 18   SpO2 100%  Gen:   Awake, no distress   Resp:  Normal effort  MSK:   Moves extremities without difficulty  Other:  Uvula is deviated to the left.  Medical Decision Making  Medically screening exam initiated at 1:27 PM.  Appropriate orders placed.  Otelia Santee was informed that the remainder of the evaluation will be completed by another provider, this initial triage assessment does not replace that evaluation, and the importance of remaining in the ED until their evaluation is complete.  Is some uvula deviation, he has elevated heart rate and fever but is not febrile or tachycardic.  Will check labs and proceed with CT soft tissues    Theron Arista, PA-C 07/01/21 1329    Sloan Leiter, DO 07/02/21 [phone]

## 2021-07-01 NOTE — ED Notes (Signed)
RN reviewed discharge instructions w/ pt. Follow up reviewed, pt had no further questions 

## 2021-08-19 ENCOUNTER — Telehealth: Payer: Self-pay | Admitting: Family

## 2021-08-19 NOTE — Telephone Encounter (Signed)
Pt called in requesting a refill on her bp medication- amlodipine

## 2021-08-20 ENCOUNTER — Other Ambulatory Visit: Payer: Self-pay

## 2021-08-20 ENCOUNTER — Telehealth: Payer: Self-pay | Admitting: Family

## 2021-08-20 DIAGNOSIS — K219 Gastro-esophageal reflux disease without esophagitis: Secondary | ICD-10-CM

## 2021-08-20 MED ORDER — OMEPRAZOLE 40 MG PO CPDR: 40.0000 mg | DELAYED_RELEASE_CAPSULE | Freq: Every day | ORAL | 0 refills | Status: DC

## 2021-08-20 NOTE — Telephone Encounter (Signed)
Medication refill request completed.  

## 2021-08-20 NOTE — Telephone Encounter (Signed)
Pt calling in for URGENT -PT states he's out refill for  omeprazole (PRILOSEC) 40 MG capsule [[id number]]  Pharmacy  [facility] [address] ([address] [place] - [address]  [address] [place] ([place]) [place] [address]  Phone:  [phone]  Fax:  [phone]

## 2021-11-02 ENCOUNTER — Telehealth: Payer: Self-pay | Admitting: Family

## 2021-11-02 NOTE — Telephone Encounter (Signed)
omeprazole (PRILOSEC) 40 MG capsule [[id number]]   cetirizine (ZYRTEC) 10 MG tablet [[id number]]   Pharmacy  [facility] [address] ([address] [place] - [address]  [address] [place] ([place]) [place] [address]  Phone:  [phone]  Fax:  [phone]

## 2021-11-04 ENCOUNTER — Other Ambulatory Visit: Payer: Self-pay

## 2021-11-04 DIAGNOSIS — J302 Other seasonal allergic rhinitis: Secondary | ICD-10-CM

## 2021-11-04 DIAGNOSIS — K219 Gastro-esophageal reflux disease without esophagitis: Secondary | ICD-10-CM

## 2021-11-04 MED ORDER — MONTELUKAST SODIUM 10 MG PO TABS: 10.0000 mg | ORAL_TABLET | Freq: Every morning | ORAL | 0 refills | Status: DC

## 2021-11-04 MED ORDER — OMEPRAZOLE 40 MG PO CPDR: 40.0000 mg | DELAYED_RELEASE_CAPSULE | Freq: Every day | ORAL | 0 refills | Status: DC

## 2021-11-04 NOTE — Telephone Encounter (Signed)
Medication request completed  °

## 2021-11-09 ENCOUNTER — Other Ambulatory Visit: Payer: Self-pay | Admitting: Family

## 2021-11-09 DIAGNOSIS — J302 Other seasonal allergic rhinitis: Secondary | ICD-10-CM

## 2021-11-10 ENCOUNTER — Other Ambulatory Visit: Payer: Self-pay

## 2021-11-10 DIAGNOSIS — J302 Other seasonal allergic rhinitis: Secondary | ICD-10-CM

## 2021-11-15 DIAGNOSIS — N281 Cyst of kidney, acquired: Secondary | ICD-10-CM | POA: Insufficient documentation

## 2021-11-15 DIAGNOSIS — N200 Calculus of kidney: Secondary | ICD-10-CM | POA: Insufficient documentation

## 2022-02-05 ENCOUNTER — Ambulatory Visit (INDEPENDENT_AMBULATORY_CARE_PROVIDER_SITE_OTHER): Payer: BLUE CROSS/BLUE SHIELD

## 2022-02-05 ENCOUNTER — Other Ambulatory Visit: Payer: Self-pay

## 2022-02-05 ENCOUNTER — Encounter: Payer: Self-pay | Admitting: Emergency Medicine

## 2022-02-05 ENCOUNTER — Ambulatory Visit
Admission: EM | Admit: 2022-02-05 | Discharge: 2022-02-05 | Disposition: A | Discharge status: Home / Self Care | Payer: BLUE CROSS/BLUE SHIELD | Attending: Family Medicine | Admitting: Family Medicine

## 2022-02-05 DIAGNOSIS — J019 Acute sinusitis, unspecified: Secondary | ICD-10-CM | POA: Diagnosis not present

## 2022-02-05 DIAGNOSIS — R059 Cough, unspecified: Secondary | ICD-10-CM | POA: Diagnosis not present

## 2022-02-05 DIAGNOSIS — J4521 Mild intermittent asthma with (acute) exacerbation: Secondary | ICD-10-CM

## 2022-02-05 DIAGNOSIS — J45909 Unspecified asthma, uncomplicated: Secondary | ICD-10-CM

## 2022-02-05 MED ORDER — DOXYCYCLINE HYCLATE 100 MG PO CAPS: 100.0000 mg | ORAL_CAPSULE | Freq: Two times a day (BID) | ORAL | 0 refills | Status: AC

## 2022-02-05 MED ORDER — ALBUTEROL SULFATE HFA 108 (90 BASE) MCG/ACT IN AERS
2.0000 | INHALATION_SPRAY | RESPIRATORY_TRACT | 0 refills | Status: AC | PRN
Start: 2022-02-05 — End: ?

## 2022-02-05 MED ORDER — PREDNISONE 20 MG PO TABS: 40.0000 mg | ORAL_TABLET | Freq: Every day | ORAL | 0 refills | Status: AC

## 2022-02-05 MED ORDER — BENZONATATE 100 MG PO CAPS: 100.0000 mg | ORAL_CAPSULE | Freq: Three times a day (TID) | ORAL | 0 refills | Status: AC | PRN

## 2022-02-05 NOTE — ED Triage Notes (Signed)
Pt here for cough x 2 weeks with some blood tinged sputum this am

## 2022-02-05 NOTE — Discharge Instructions (Addendum)
Your chest x-ray was clear  Albuterol inhaler--do 2 puffs every 4 hours as needed for shortness of breath or wheezing  Take benzonatate 100 mg, 1 tab every 8 hours as needed for cough.  Prednisone 20 mg--2 daily for 5 days  Take doxycycline 100 mg--1 capsule 2 times daily for 7 days.  I am prescribing this due to the length of your symptoms, which probably indicates she have a sinus infection

## 2022-02-05 NOTE — ED Provider Notes (Signed)
[facility]    CSN: [id number] Arrival date & time: 02/05/22  0843      History   Chief Complaint Chief Complaint  Patient presents with   Cough    HPI Matthew Mcpherson is a 46 y.o. male.    Cough Here for a 2-week history of cough, postnasal congestion, and shortness of breath.  He also reports fevers to 100-101 for the last week.  No vomiting or diarrhea.  He has felt short of breath and is using his inhaler more often.  He last used his inhaler last night  Past Medical History:  Diagnosis Date   Allergy    [employer] 12/20/2020    Patient Active Problem List   Diagnosis Date Noted   Flatulence, eructation and gas pain 12/23/2020   Right upper quadrant pain 12/23/2020   Seasonal allergies 07/16/2020   Gastroesophageal reflux disease without esophagitis 07/16/2020   SOB (shortness of breath) 07/16/2020   Elevated blood pressure reading in office without diagnosis of hypertension 07/16/2020   Mild asthma without complication 07/16/2020   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 No pertinent surgical history.     Home Medications    Prior to Admission medications   Medication Sig Start Date End Date Taking? Authorizing Provider  benzonatate (TESSALON) 100 MG capsule Take 1 capsule (100 mg total) by mouth 3 (three) times daily as needed for cough. 02/05/22  Yes Ninnie Fein, Janace Aris, MD  doxycycline (VIBRAMYCIN) 100 MG capsule Take 1 capsule (100 mg total) by mouth 2 (two) times daily for 7 days. 02/05/22 02/12/22 Yes Macarthur Lorusso, Janace Aris, MD  predniSONE (DELTASONE) 20 MG tablet Take 2  tablets (40 mg total) by mouth daily with breakfast for 5 days. 02/05/22 02/10/22 Yes Zenia Resides, MD  albuterol (VENTOLIN HFA) 108 (90 Base) MCG/ACT inhaler Inhale 2 puffs into the lungs every 4 (four) hours as needed for wheezing or shortness of breath. 02/05/22   Zenia Resides, MD  cetirizine (ZYRTEC) 10 MG tablet Take 1 tablet (10 mg total) by mouth daily. 06/29/21   Rema Fendt, NP  fluticasone (FLONASE) 50 MCG/ACT nasal spray Place 1 spray into both nostrils in the morning and at bedtime. 02/04/21   Particia Nearing, PA-C  montelukast (SINGULAIR) 10 MG tablet Take 1 tablet (10 mg total) by mouth in the morning. 11/04/21   Rema Fendt, NP  omeprazole (PRILOSEC) 40 MG capsule Take 1 capsule (40 mg total) by mouth daily. 11/04/21   Rema Fendt, NP    Family History History reviewed. No pertinent family history.  Social History Social History   Tobacco Use   Smoking status: Never   Smokeless tobacco: Never  Vaping Use   Vaping Use: Never used  Substance Use Topics   Alcohol use: Not Currently   Drug use: Never     Allergies   Other and Peanut-containing drug products   Review of Systems Review of Systems  Respiratory:  Positive for cough.     Physical Exam Triage Vital Signs ED Triage Vitals [02/05/22 0909]  Enc Vitals Group     BP (!) 146/84  Pulse Rate 71     Resp 18     Temp 98.4 F (36.9 C)     Temp Source Oral     SpO2 98 %     Weight      Height      Head Circumference      Peak Flow      Pain Score 4     Pain Loc      Pain Edu?      Excl. in GC?    No data found.  Updated Vital Signs BP (!) 146/84 (BP Location: Left Arm)   Pulse 71   Temp 98.4 F (36.9 C) (Oral)   Resp 18   SpO2 98%   Visual Acuity Right Eye Distance:   Left Eye Distance:   Bilateral Distance:    Right Eye Near:   Left Eye Near:    Bilateral Near:     Physical Exam Vitals reviewed.  Constitutional:      General: He is not in acute  distress.    Appearance: He is not ill-appearing, toxic-appearing or diaphoretic.  HENT:     Right Ear: Tympanic membrane normal.     Left Ear: Tympanic membrane normal.     Nose: Congestion present.     Mouth/Throat:     Mouth: Mucous membranes are moist.     Comments: There is mild erythema of the oropharynx and there is some white and yellow exudate in the tonsillar crypts Eyes:     Extraocular Movements: Extraocular movements intact.     Conjunctiva/sclera: Conjunctivae normal.     Pupils: Pupils are equal, round, and reactive to light.  Cardiovascular:     Rate and Rhythm: Normal rate and regular rhythm.     Heart sounds: No murmur heard. Pulmonary:     Breath sounds: No stridor. No wheezing, rhonchi or rales.     Comments: Breath sounds are coarse Musculoskeletal:     Cervical back: Neck supple.  Lymphadenopathy:     Cervical: No cervical adenopathy.  Skin:    Capillary Refill: Capillary refill takes less than 2 seconds.     Coloration: Skin is not jaundiced or pale.  Neurological:     General: No focal deficit present.     Mental Status: He is oriented to person, place, and time.  Psychiatric:        Behavior: Behavior normal.     UC Treatments / Results  Labs (all labs ordered are listed, but only abnormal results are displayed) Labs Reviewed - No data to display  EKG   Radiology DG Chest 2 View  Result Date: 02/05/2022 CLINICAL DATA:  2 week history of cough. EXAM: CHEST - 2 VIEW COMPARISON:  07/16/2020 FINDINGS: The lungs are clear without focal pneumonia, edema, pneumothorax or pleural effusion. The cardiopericardial silhouette is within normal limits for size. The visualized bony structures of the thorax are unremarkable. IMPRESSION: No acute cardiopulmonary findings. Electronically Signed   By: Kennith CenterEric  Mansell M.D.   On: 02/05/2022 09:45    Procedures Procedures (including critical care time)  Medications Ordered in UC Medications - No data to  display  Initial Impression / Assessment and Plan / UC Course  I have reviewed the triage vital signs and the nursing notes.  Pertinent labs & imaging results that were available during my care of the patient were reviewed by me and considered in my medical decision making (see chart for details).     Chest x-ray is clear.  I am going to treat for asthma exacerbation and also for acute sinusitis due to the length of his symptoms. Final Clinical Impressions(s) / UC Diagnoses   Final diagnoses:  Mild intermittent asthma with acute exacerbation  Acute sinusitis, recurrence not specified, unspecified location     Discharge Instructions      Your chest x-ray was clear  Albuterol inhaler--do 2 puffs every 4 hours as needed for shortness of breath or wheezing  Take benzonatate 100 mg, 1 tab every 8 hours as needed for cough.  Prednisone 20 mg--2 daily for 5 days  Take doxycycline 100 mg--1 capsule 2 times daily for 7 days.  I am prescribing this due to the length of your symptoms, which probably indicates she have a sinus infection     ED Prescriptions     Medication Sig Dispense Auth. Provider   albuterol (VENTOLIN HFA) 108 (90 Base) MCG/ACT inhaler Inhale 2 puffs into the lungs every 4 (four) hours as needed for wheezing or shortness of breath. 9 g Zenia Resides, MD   doxycycline (VIBRAMYCIN) 100 MG capsule Take 1 capsule (100 mg total) by mouth 2 (two) times daily for 7 days. 14 capsule Zenia Resides, MD   predniSONE (DELTASONE) 20 MG tablet Take 2 tablets (40 mg total) by mouth daily with breakfast for 5 days. 10 tablet Zenia Resides, MD   benzonatate (TESSALON) 100 MG capsule Take 1 capsule (100 mg total) by mouth 3 (three) times daily as needed for cough. 21 capsule Zenia Resides, MD      PDMP not reviewed this encounter.   Zenia Resides, MD 02/05/22 [phone]

## 2022-03-30 ENCOUNTER — Other Ambulatory Visit: Payer: Self-pay | Admitting: Family

## 2022-03-30 DIAGNOSIS — J302 Other seasonal allergic rhinitis: Secondary | ICD-10-CM

## 2022-03-31 MED ORDER — MONTELUKAST SODIUM 10 MG PO TABS
10.0000 mg | ORAL_TABLET | Freq: Every morning | ORAL | 0 refills | Status: AC
Start: 2022-03-31 — End: ?

## 2022-04-15 NOTE — Progress Notes (Deleted)
Patient ID: Matthew Mcpherson, male    DOB: 1975/09/15  MRN: [id number]  CC: No chief complaint on file.   Subjective: Matthew Mcpherson is a 46 y.o. male who presents for  His concerns today include:   per dentist told to request referral for ENT or Pulmonologist for sleep apnea   Patient Active Problem List   Diagnosis Date Noted   Flatulence, eructation and gas pain 12/23/2020   Right upper quadrant pain 12/23/2020   Seasonal allergies 07/16/2020   Gastroesophageal reflux disease without esophagitis 07/16/2020   SOB (shortness of breath) 07/16/2020   Elevated blood pressure reading in office without diagnosis of hypertension 07/16/2020   Mild asthma without complication 07/16/2020   Laryngopharyngeal reflux 11/06/2015   Lumbar sprain 06/25/2008   Unspecified viral infection, in conditions classified elsewhere and of unspecified site 04/08/2008   Abnormal levels of other serum enzymes 12/24/2007   Diaphragmatic hernia 12/24/2007   Abnormal weight gain 12/19/2007   Constipation 12/19/2007   Dermatophytosis of groin and perianal area 12/19/2007   Low back pain 12/19/2007     Current Outpatient Medications on File Prior to Visit  Medication Sig Dispense Refill   albuterol (VENTOLIN HFA) 108 (90 Base) MCG/ACT inhaler Inhale 2 puffs into the lungs every 4 (four) hours as needed for wheezing or shortness of breath. 9 g 0   benzonatate (TESSALON) 100 MG capsule Take 1 capsule (100 mg total) by mouth 3 (three) times daily as needed for cough. 21 capsule 0   cetirizine (ZYRTEC) 10 MG tablet Take 1 tablet (10 mg total) by mouth daily. 30 tablet 2   fluticasone (FLONASE) 50 MCG/ACT nasal spray Place 1 spray into both nostrils in the morning and at bedtime. 16 g 0   montelukast (SINGULAIR) 10 MG tablet Take 1 tablet (10 mg total) by mouth in the morning. 90 tablet 0   omeprazole (PRILOSEC) 40 MG capsule Take 1 capsule (40 mg total) by mouth daily. 90 capsule 0   No current  facility-administered medications on file prior to visit.    Allergies  Allergen Reactions   Other Rash   Peanut-Containing Drug Products Rash    Patient denies allergen at this time. Patient shares he eats peanuts frequently and has no reaction.    Social History   Socioeconomic History   Marital status: Married    Spouse name: Not on file   Number of children: Not on file   Years of education: Not on file   Highest education level: Not on file  Occupational History   Not on file  Tobacco Use   Smoking status: Never   Smokeless tobacco: Never  Vaping Use   Vaping Use: Never used  Substance and Sexual Activity   Alcohol use: Not Currently   Drug use: Never   Sexual activity: Not Currently  Other Topics Concern   Not on file  Social History Narrative   Not on file   Social Determinants of Health   Financial Resource Strain: Not on file  Food Insecurity: Not on file  Transportation Needs: Not on file  Physical Activity: Not on file  Stress: Not on file  Social Connections: Not on file  Intimate Partner Violence: Not on file    No family history on file.  No past surgical history on file.  ROS: Review of Systems Negative except as stated above  PHYSICAL EXAM: There were no vitals taken for this visit.  Physical Exam  {male adult master:310786} {male adult master:310785}  Latest Ref Rng & Units 07/01/2021    2:56 PM 11/04/2020   12:18 PM 10/05/2020    6:17 PM  CMP  Glucose 70 - 99 mg/dL 93  371  86   BUN 6 - 20 mg/dL 13  12  11    Creatinine 0.61 - 1.24 mg/dL  0.62  6.94   Sodium 135 - 145 mmol/L 137  138  144   Potassium 3.5 - 5.1 mmol/L 3.5  4.1  4.2   Chloride 98 - 111 mmol/L 104  102  106   CO2 22 - 32 mmol/L 22  26  26    Calcium 8.9 - 10.3 mg/dL 9.1  8.9  9.4   Total Protein 6.5 - 8.1 g/dL  6.9  7.2   Total Bilirubin 0.3 - 1.2 mg/dL  1.2  0.7   Alkaline Phos 38 - 126 U/L  48  65   AST 15 - 41 U/L  20  18   ALT 0 - 44 U/L  44  44     Lipid Panel     Component Value Date/Time   CHOL 169 12/23/2020 0930   TRIG 288 (H) 12/23/2020 0930   HDL 35 (L) 12/23/2020 0930   CHOLHDL 4.8 12/23/2020 0930   LDLCALC 86 12/23/2020 0930    CBC    Component Value Date/Time   WBC 4.8 07/01/2021 1456   RBC 5.21 07/01/2021 1456   HGB 15.2 07/01/2021 1456   HGB 16.0 10/05/2020 1817   HCT 45.2 07/01/2021 1456   HCT 46.6 10/05/2020 1817   PLT 163 07/01/2021 1456   PLT 219 10/05/2020 1817   MCV 86.8 07/01/2021 1456   MCV 87 10/05/2020 1817   MCH 29.2 07/01/2021 1456   MCHC 33.6 07/01/2021 1456   RDW 12.5 07/01/2021 1456   RDW 12.7 10/05/2020 1817   LYMPHSABS 0.7 07/01/2021 1456   LYMPHSABS 2.8 10/05/2020 1817   MONOABS 0.4 07/01/2021 1456   EOSABS 0.1 07/01/2021 1456   EOSABS 0.1 10/05/2020 1817   BASOSABS 0.0 07/01/2021 1456   BASOSABS 0.0 10/05/2020 1817    ASSESSMENT AND PLAN:  There are no diagnoses linked to this encounter.   Patient was given the opportunity to ask questions.  Patient verbalized understanding of the plan and was able to repeat key elements of the plan. Patient was given clear instructions to go to Emergency Department or return to medical center if symptoms don't improve, worsen, or new problems develop.The patient verbalized understanding.   No orders of the defined types were placed in this encounter.    Requested Prescriptions    No prescriptions requested or ordered in this encounter    No follow-ups on file.  07/03/2021, NP

## 2022-04-19 ENCOUNTER — Ambulatory Visit: Payer: BLUE CROSS/BLUE SHIELD | Admitting: Family

## 2022-05-01 ENCOUNTER — Other Ambulatory Visit: Payer: Self-pay | Admitting: Family

## 2022-05-01 DIAGNOSIS — K219 Gastro-esophageal reflux disease without esophagitis: Secondary | ICD-10-CM

## 2022-05-02 MED ORDER — OMEPRAZOLE 40 MG PO CPDR: 40.0000 mg | DELAYED_RELEASE_CAPSULE | Freq: Every day | ORAL | 0 refills | Status: DC

## 2022-06-19 ENCOUNTER — Other Ambulatory Visit: Payer: Self-pay | Admitting: Family

## 2022-06-19 DIAGNOSIS — J302 Other seasonal allergic rhinitis: Secondary | ICD-10-CM

## 2022-06-20 DIAGNOSIS — J309 Allergic rhinitis, unspecified: Secondary | ICD-10-CM | POA: Insufficient documentation

## 2022-06-20 NOTE — Telephone Encounter (Signed)
Requested medications are due for refill today.  yes  Requested medications are on the active medications list.  yes  Last refill. 03/31/2022 #90 0 rf  Future visit scheduled.   no  Notes to clinic.  PT last seen 12/23/2020. No PCP listed    Requested Prescriptions  Pending Prescriptions Disp Refills   montelukast (SINGULAIR) 10 MG tablet [Pharmacy Med Name: Montelukast Sodium 10 MG Oral Tablet] 90 tablet 0    Sig: TAKE 1 TABLET BY MOUTH IN THE MORNING     Pulmonology:  Leukotriene Inhibitors Failed - 06/19/2022 11:03 AM      Failed - Valid encounter within last 12 months    Recent Outpatient Visits           1 year ago Annual physical exam   [facility] at [facility], [place], NP   1 year ago Encounter to establish care   [facility] at [facility], [place], NP   1 year ago Seasonal allergies   Primary Care at [facility], [place]aine Grip, [place]

## 2023-05-12 ENCOUNTER — Ambulatory Visit
Admission: EM | Admit: 2023-05-12 | Discharge: 2023-05-12 | Disposition: A | Discharge status: Home / Self Care | Payer: BLUE CROSS/BLUE SHIELD | Source: Home / Self Care

## 2023-05-12 DIAGNOSIS — L309 Dermatitis, unspecified: Secondary | ICD-10-CM | POA: Diagnosis not present

## 2023-05-12 MED ORDER — PREDNISONE 20 MG PO TABS: 40.0000 mg | ORAL_TABLET | Freq: Every day | ORAL | 0 refills | Status: AC

## 2023-05-12 MED ORDER — TRIAMCINOLONE ACETONIDE 0.1 % EX CREA: 1.0000 | TOPICAL_CREAM | Freq: Two times a day (BID) | CUTANEOUS | 0 refills | Status: AC

## 2023-05-12 NOTE — ED Provider Notes (Signed)
[facility]    CSN: [id number] Arrival date & time: 05/12/23  0856      History   Chief Complaint Chief Complaint  Patient presents with   Rash    HPI Matthew Mcpherson is a 47 y.o. male.   Patient here today for evaluation of rash and bumps on his arms.  He states that they have been present for a month or more and will wax and wane.  He notes rash is itchy at times.  He does work outside.  He has tried topical treatment without resolution.  The history is provided by the patient.  Rash Associated symptoms: no fever and no shortness of breath     Past Medical History:  Diagnosis Date   Allergy    [employer] 12/20/2020    Patient Active Problem List   Diagnosis Date Noted   Allergic rhinitis 06/20/2022   Kidney stones 11/15/2021   Renal cyst 11/15/2021   Flatulence, eructation and gas pain 12/23/2020   Right upper quadrant pain 12/23/2020   Seasonal allergies 07/16/2020   Gastroesophageal reflux disease without esophagitis 07/16/2020   SOB (shortness of breath) 07/16/2020   Elevated blood pressure reading in office without diagnosis of hypertension 07/16/2020   Mild asthma without complication 07/16/2020   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 No pertinent surgical history.     Home Medications    Prior to Admission medications   Medication Sig Start Date End Date Taking? Authorizing Provider  cetirizine (ZYRTEC) 10 MG tablet Take 1 tablet (10 mg total) by mouth daily. 06/29/21  Yes Zonia Kief, Amy J, NP  fluticasone (FLONASE) 50 MCG/ACT nasal spray Place 1 spray into both  nostrils in the morning and at bedtime. 02/04/21  Yes Particia Nearing, PA-C  montelukast (SINGULAIR) 10 MG tablet Take 1 tablet (10 mg total) by mouth in the morning. 03/31/22  Yes Zonia Kief, Amy J, NP  predniSONE (DELTASONE) 20 MG tablet Take 2 tablets (40 mg total) by mouth daily with breakfast for 5 days. 05/12/23 05/17/23 Yes Tomi Bamberger, PA-C  triamcinolone cream (KENALOG) 0.1 % Apply 1 Application topically 2 (two) times daily. 05/12/23  Yes Tomi Bamberger, PA-C  albuterol (VENTOLIN HFA) 108 (90 Base) MCG/ACT inhaler Inhale 2 puffs into the lungs every 4 (four) hours as needed for wheezing or shortness of breath. 02/05/22   Banister, Janace Aris, MD  benzonatate (TESSALON) 100 MG capsule Take 1 capsule (100 mg total) by mouth 3 (three) times daily as needed for cough. 02/05/22   Zenia Resides, MD  omeprazole (PRILOSEC) 40 MG capsule Take 1 capsule (40 mg total) by mouth daily. 05/02/22   Rema Fendt, NP    Family History History reviewed. No pertinent family history.  Social History Social History   Tobacco Use   Smoking status: Never   Smokeless tobacco: Never  Vaping Use   Vaping status: Never Used  Substance Use Topics   Alcohol use: Not Currently   Drug use: Never     Allergies   Peanut-containing drug products   Review of Systems Review of Systems  Constitutional:  Negative for chills and fever.  HENT:  Negative for trouble swallowing.   Eyes:  Negative for discharge and redness.  Respiratory:  Negative for shortness of breath.   Skin:  Positive for rash.  Neurological:  Negative for numbness.     Physical Exam Triage Vital Signs ED Triage Vitals  Encounter Vitals Group     BP 05/12/23 0906 131/86     Systolic BP Percentile --      Diastolic BP Percentile --      Pulse Rate 05/12/23 0906 67     Resp 05/12/23 0906 18     Temp 05/12/23 0906 98.1 F (36.7 C)     Temp Source 05/12/23 0906 Oral     SpO2 05/12/23 0906 98 %     Weight 05/12/23 0904  170 lb (77.1 kg)     Height 05/12/23 0904 5\' 4"  (1.626 m)     Head Circumference --      Peak Flow --      Pain Score 05/12/23 0902 0     Pain Loc --      Pain Education --      Exclude from Growth Chart --    No data found.  Updated Vital Signs BP 131/86 (BP Location: Right Arm)   Pulse 67   Temp 98.1 F (36.7 C) (Oral)   Resp 18   Ht 5\' 4"  (1.626 m)   Wt 170 lb (77.1 kg)   SpO2 98%   BMI 29.18 kg/m      Physical Exam Vitals and nursing note reviewed.  Constitutional:      General: He is not in acute distress.    Appearance: Normal appearance. He is not ill-appearing.  HENT:     Head: Normocephalic and atraumatic.  Eyes:     Conjunctiva/sclera: Conjunctivae normal.  Cardiovascular:     Rate and Rhythm: Normal rate.  Pulmonary:     Effort: Pulmonary effort is normal. No respiratory distress.  Skin:    Findings: Rash (erythematous vesiculopapular rash to right arm, right neck) present.  Neurological:     Mental Status: He is alert.  Psychiatric:        Mood and Affect: Mood normal.        Behavior: Behavior normal.        Thought Content: Thought content normal.      UC Treatments / Results  Labs (all labs ordered are listed, but only abnormal results are displayed) Labs Reviewed - No data to display  EKG   Radiology No results found.  Procedures Procedures (including critical care time)  Medications Ordered in UC Medications - No data to display  Initial Impression / Assessment and Plan / UC Course  I have reviewed the triage vital signs and the nursing notes.  Pertinent labs & imaging results that were available during my care of the patient were reviewed by me and considered in my medical decision making (see chart for details).    Prednisone burst prescribed and steroid cream prescribed. Advised follow up if no gradual improvement or with any further concerns.   Final Clinical Impressions(s) / UC Diagnoses   Final diagnoses:  Dermatitis    Discharge Instructions   None    ED Prescriptions     Medication Sig Dispense Auth. Provider   predniSONE (DELTASONE) 20 MG tablet Take 2 tablets (40 mg total) by mouth daily with breakfast for 5 days. 10 tablet Erma Pinto F, PA-C   triamcinolone cream (KENALOG) 0.1 % Apply 1 Application topically 2 (two)  times daily. 30 g Tomi Bamberger, PA-C      PDMP not reviewed this encounter.   Tomi Bamberger, PA-C 05/12/23 1233

## 2023-05-12 NOTE — ED Triage Notes (Signed)
"  I have rash/bumps on my arms". This has been "a month or more". "Come and goes". No PCP.

## 2023-07-24 ENCOUNTER — Encounter: Payer: BLUE CROSS/BLUE SHIELD | Admitting: Family

## 2023-07-24 NOTE — Progress Notes (Signed)
Erroneous encounter-disregard

## 2023-08-16 ENCOUNTER — Ambulatory Visit: Payer: BLUE CROSS/BLUE SHIELD | Admitting: Family

## 2023-08-30 ENCOUNTER — Ambulatory Visit: Payer: BLUE CROSS/BLUE SHIELD | Admitting: Family

## 2023-09-11 ENCOUNTER — Ambulatory Visit
Admission: EM | Admit: 2023-09-11 | Discharge: 2023-09-11 | Disposition: A | Discharge status: Home / Self Care | Payer: BLUE CROSS/BLUE SHIELD

## 2023-09-11 DIAGNOSIS — J019 Acute sinusitis, unspecified: Secondary | ICD-10-CM

## 2023-09-11 MED ORDER — AMOXICILLIN-POT CLAVULANATE 875-125 MG PO TABS: 1.0000 | ORAL_TABLET | Freq: Two times a day (BID) | ORAL | 0 refills | Status: AC

## 2023-09-11 NOTE — ED Triage Notes (Signed)
"  I have had sinus problems, aches, pain for about 3 wks now". "My throat is killing me and the mucous is thick and can't clear it up". No fever known.

## 2024-07-08 ENCOUNTER — Other Ambulatory Visit: Payer: Self-pay | Admitting: Family Medicine

## 2024-07-08 DIAGNOSIS — R590 Localized enlarged lymph nodes: Secondary | ICD-10-CM

## 2024-07-09 ENCOUNTER — Inpatient Hospital Stay: Admission: RE | Admit: 2024-07-09 | Discharge: 2024-07-09 | Attending: Family Medicine | Admitting: Family Medicine

## 2024-07-09 DIAGNOSIS — R590 Localized enlarged lymph nodes: Secondary | ICD-10-CM
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
## 2024-08-07 ENCOUNTER — Ambulatory Visit: Attending: Internal Medicine | Admitting: Internal Medicine

## 2024-08-07 ENCOUNTER — Encounter: Payer: Self-pay | Admitting: Internal Medicine

## 2024-08-07 VITALS — BP 127/75 | HR 60 | Temp 97.6°F | Resp 16 | Ht 64.25 in | Wt 186.2 lb

## 2024-08-07 DIAGNOSIS — K219 Gastro-esophageal reflux disease without esophagitis: Secondary | ICD-10-CM | POA: Diagnosis present

## 2024-08-07 DIAGNOSIS — R590 Localized enlarged lymph nodes: Secondary | ICD-10-CM | POA: Insufficient documentation

## 2024-08-07 DIAGNOSIS — K449 Diaphragmatic hernia without obstruction or gangrene: Secondary | ICD-10-CM | POA: Insufficient documentation

## 2024-08-07 DIAGNOSIS — R1310 Dysphagia, unspecified: Secondary | ICD-10-CM | POA: Diagnosis present

## 2024-08-07 DIAGNOSIS — J45909 Unspecified asthma, uncomplicated: Secondary | ICD-10-CM | POA: Insufficient documentation

## 2024-08-07 NOTE — Patient Instructions (Addendum)
 For chronic dry mouth is important to stay well-hydrated.  You can also use sugar-free gum or lozenges to stimulate the saliva production.  Biotene mouthwash or lozenges may also be helpful.  Products into XyliMelts also work to stimulate saliva production.  Continue following up with your dentist regularly because dryness problems can increase the risk of tooth and gum decay.  If symptoms get worse there are medications for stimulating tear and saliva production but will try all the other options first.

## 2024-08-08 LAB — SJOGRENS SYNDROME-A EXTRACTABLE NUCLEAR ANTIBODY: SSA (Ro) (ENA) Antibody, IgG: <1 AI

## 2024-08-08 LAB — SJOGRENS SYNDROME-B EXTRACTABLE NUCLEAR ANTIBODY: SSB (La) (ENA) Antibody, IgG: <1 AI

## 2024-08-08 LAB — ANA: Anti Nuclear Antibody (ANA): NEGATIVE

## 2024-08-14 ENCOUNTER — Encounter (INDEPENDENT_AMBULATORY_CARE_PROVIDER_SITE_OTHER): Payer: Self-pay | Admitting: Otolaryngology

## 2024-08-14 ENCOUNTER — Ambulatory Visit (INDEPENDENT_AMBULATORY_CARE_PROVIDER_SITE_OTHER): Admitting: Otolaryngology

## 2024-08-14 VITALS — BP 131/85 | HR 74 | Temp 98.3°F | Ht 64.0 in | Wt 185.0 lb

## 2024-08-14 DIAGNOSIS — K146 Glossodynia: Secondary | ICD-10-CM

## 2024-08-14 DIAGNOSIS — K219 Gastro-esophageal reflux disease without esophagitis: Secondary | ICD-10-CM | POA: Diagnosis not present

## 2024-08-14 DIAGNOSIS — M792 Neuralgia and neuritis, unspecified: Secondary | ICD-10-CM

## 2024-08-14 DIAGNOSIS — R591 Generalized enlarged lymph nodes: Secondary | ICD-10-CM

## 2024-08-14 DIAGNOSIS — Z7722 Contact with and (suspected) exposure to environmental tobacco smoke (acute) (chronic): Secondary | ICD-10-CM | POA: Diagnosis not present

## 2024-08-14 MED ORDER — PANTOPRAZOLE SODIUM 40 MG PO TBEC: 40.0000 mg | DELAYED_RELEASE_TABLET | Freq: Two times a day (BID) | ORAL | 1 refills | Status: AC

## 2024-08-14 NOTE — Progress Notes (Signed)
 Reason for Consult: Lymphadenopathy Referring Physician: Dr. Durel Plenty Cislo is an 48 y.o. male.  HPI: He is here for a complicated history of multiple complaints.  He has been frustrated with a problem with dryness feeling of his mouth and tongue.  Sometimes his tongue tingles.  It has a sensitivity to it at times and even sometimes feels numb.  He also has a pain in the left side of his neck that will occur once every week or 2.  It lasts just a few seconds.  It is a stabbing pain.  Not related to eating.  He is able to get food and water  down fine.  He does have a mucus feeling in the throat.  Sometimes he has a spell where he has inspiratory stridor that is very brief.  He is not able to talk well with this spell.  He has 1 of those very infrequently.  He has tried reflux medication and had reflux evaluation.  He takes once a day reflux medication.  He knows he has that problem.  He had a ultrasound which showed a 4 mm nonpathologically enlarged lymph node in the left neck.  He has had lab work.  He did have a Sjogren's profile that was negative.  He is not sure if he has had any other lab work.  Past Medical History:  Diagnosis Date   Acid reflux    Allergy    [employer] 12/20/2020    History reviewed. No pertinent surgical history.  Family History  Problem Relation Age of Onset   Healthy Mother    Healthy Father     Social History:  reports that he has never smoked. He has been exposed to tobacco smoke. He has never used smokeless tobacco. He reports that he does not currently use alcohol. He reports that he does not use drugs.  Allergies:  Allergies  Allergen Reactions   Other Swelling and Rash    Other Reaction(s): Other (See Comments)  Tree Nuts ?, Per patient. Wall plug ins/air fresheners- per patient   Peanut-Containing Drug Products Other (See Comments) and Rash    Patient denies allergen at this time. Patient shares he eats peanuts frequently and has no  reaction.  Patient says No to this allergy. To discuss with provider.  Other Reaction(s): Other (See Comments)  Patient denies allergen at this time. Patient shares he eats peanuts frequently and has no reaction.  Patient says No to this allergy. To discuss with provider.    Medications: I have reviewed the patient's current medications.  No results found for this or any previous visit (from the past 48 hours).  No results found.  ROS Blood pressure 131/85, pulse 74, temperature 98.3 F (36.8 C), height 5' 4 (1.626 m), weight 185 lb (83.9 kg), SpO2 98%. Physical Exam Constitutional:      Appearance: Normal appearance.  HENT:     Head: Normocephalic and atraumatic.     Right Ear: Tympanic membrane is without lesions and middle ear aerated, ear canal and external ear normal.     Left Ear: Tympanic membrane is without lesions and middle ear aerated, ear canal and external ear normal.     Nose: Nose normal. Turbinates with mild hypertrophy, No significant swelling or masses.     Oral cavity/oropharynx: Mucous membranes are moist. No lesions or masses    Larynx: normal voice. Mirror attempted without success    Eyes:     Extraocular Movements: Extraocular movements intact.  Conjunctiva/sclera: Conjunctivae normal.     Pupils: Pupils are equal, round, and reactive to light.  Cardiovascular:     Rate and Rhythm: Normal rate.  Pulmonary:     Effort: Pulmonary effort is normal.  Musculoskeletal:     Cervical back: Normal range of motion and neck supple. No rigidity.  Lymphadenopathy:     Cervical: No cervical adenopathy or masses.salivary glands without lesions. .  Neurological:     Mental Status: He is alert. CN 2-12 intact. No nystagmus      Assessment/Plan: LPR/burning tongue syndrome/neuralgia-I do not think the lymph node is anything of concern.  He has a very odd distribution of complaints that I do not know exactly what is going on.  I do think burning tongue  could be a component.  He will get a B12 and folate.  I do not know if he has had an ANA yet but that should be drawn as well.  He we will try reflux medication in a twice daily fashion with Protonix  and then follow-up in 1 month to discuss how that helped or did not.  His pain in the left side of his neck seems like a neuralgia problem.  A CT scan may be beneficial once we have tried other modalities.  I talked to him about a fiberoptic exam and he does not want to do that until after he treats with the Protonix .  Norleen Notice 08/14/2024, 11:13 AM

## 2024-09-17 ENCOUNTER — Ambulatory Visit (INDEPENDENT_AMBULATORY_CARE_PROVIDER_SITE_OTHER): Admitting: Otolaryngology
